# Patient Record
Sex: Male | Born: 1960
Health system: Southern US, Community
[De-identification: ages and names within clinical notes are randomized; demographics above are authoritative.]

## PROBLEM LIST (undated history)

## (undated) DIAGNOSIS — M545 Low back pain, unspecified: Secondary | ICD-10-CM

## (undated) DIAGNOSIS — E119 Type 2 diabetes mellitus without complications: Secondary | ICD-10-CM

## (undated) DIAGNOSIS — K219 Gastro-esophageal reflux disease without esophagitis: Secondary | ICD-10-CM

## (undated) DIAGNOSIS — I10 Essential (primary) hypertension: Secondary | ICD-10-CM

## (undated) DIAGNOSIS — A159 Respiratory tuberculosis unspecified: Secondary | ICD-10-CM

## (undated) DIAGNOSIS — B1910 Unspecified viral hepatitis B without hepatic coma: Secondary | ICD-10-CM

## (undated) DIAGNOSIS — G8929 Other chronic pain: Secondary | ICD-10-CM

## (undated) HISTORY — PX: JOINT REPLACEMENT: SHX530

---
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
## 2015-08-05 ENCOUNTER — Emergency Department (HOSPITAL_COMMUNITY): Payer: Medicaid Other

## 2015-08-05 ENCOUNTER — Encounter (HOSPITAL_COMMUNITY): Payer: Self-pay

## 2015-08-05 ENCOUNTER — Observation Stay (HOSPITAL_COMMUNITY): Payer: Medicaid Other

## 2015-08-05 ENCOUNTER — Observation Stay (HOSPITAL_COMMUNITY)
Admission: EM | Admit: 2015-08-05 | Discharge: 2015-08-09 | Disposition: A | Discharge status: Home / Self Care | Payer: Medicaid Other | Attending: Internal Medicine | Admitting: Internal Medicine

## 2015-08-05 DIAGNOSIS — E119 Type 2 diabetes mellitus without complications: Secondary | ICD-10-CM

## 2015-08-05 DIAGNOSIS — M545 Low back pain: Secondary | ICD-10-CM | POA: Diagnosis not present

## 2015-08-05 DIAGNOSIS — B181 Chronic viral hepatitis B without delta-agent: Secondary | ICD-10-CM | POA: Diagnosis not present

## 2015-08-05 DIAGNOSIS — K7290 Hepatic failure, unspecified without coma: Secondary | ICD-10-CM | POA: Diagnosis not present

## 2015-08-05 DIAGNOSIS — R4689 Other symptoms and signs involving appearance and behavior: Secondary | ICD-10-CM

## 2015-08-05 DIAGNOSIS — R9431 Abnormal electrocardiogram [ECG] [EKG]: Secondary | ICD-10-CM | POA: Insufficient documentation

## 2015-08-05 DIAGNOSIS — E440 Moderate protein-calorie malnutrition: Secondary | ICD-10-CM | POA: Insufficient documentation

## 2015-08-05 DIAGNOSIS — G9340 Encephalopathy, unspecified: Secondary | ICD-10-CM | POA: Diagnosis present

## 2015-08-05 DIAGNOSIS — R5381 Other malaise: Secondary | ICD-10-CM | POA: Diagnosis not present

## 2015-08-05 DIAGNOSIS — R278 Other lack of coordination: Secondary | ICD-10-CM | POA: Diagnosis not present

## 2015-08-05 DIAGNOSIS — I4581 Long QT syndrome: Secondary | ICD-10-CM | POA: Insufficient documentation

## 2015-08-05 DIAGNOSIS — Z79899 Other long term (current) drug therapy: Secondary | ICD-10-CM | POA: Insufficient documentation

## 2015-08-05 DIAGNOSIS — R4182 Altered mental status, unspecified: Secondary | ICD-10-CM | POA: Diagnosis present

## 2015-08-05 DIAGNOSIS — N179 Acute kidney failure, unspecified: Secondary | ICD-10-CM | POA: Diagnosis not present

## 2015-08-05 DIAGNOSIS — G8929 Other chronic pain: Secondary | ICD-10-CM | POA: Diagnosis not present

## 2015-08-05 DIAGNOSIS — K7682 Hepatic encephalopathy: Secondary | ICD-10-CM | POA: Insufficient documentation

## 2015-08-05 DIAGNOSIS — Z8739 Personal history of other diseases of the musculoskeletal system and connective tissue: Secondary | ICD-10-CM | POA: Diagnosis not present

## 2015-08-05 DIAGNOSIS — M488X6 Other specified spondylopathies, lumbar region: Secondary | ICD-10-CM | POA: Diagnosis not present

## 2015-08-05 DIAGNOSIS — R740 Nonspecific elevation of levels of transaminase and lactic acid dehydrogenase [LDH]: Secondary | ICD-10-CM | POA: Diagnosis not present

## 2015-08-05 DIAGNOSIS — R7401 Elevation of levels of liver transaminase levels: Secondary | ICD-10-CM | POA: Diagnosis present

## 2015-08-05 DIAGNOSIS — M5489 Other dorsalgia: Secondary | ICD-10-CM | POA: Insufficient documentation

## 2015-08-05 DIAGNOSIS — R531 Weakness: Secondary | ICD-10-CM | POA: Insufficient documentation

## 2015-08-05 DIAGNOSIS — K2210 Ulcer of esophagus without bleeding: Secondary | ICD-10-CM | POA: Diagnosis not present

## 2015-08-05 DIAGNOSIS — I10 Essential (primary) hypertension: Secondary | ICD-10-CM | POA: Diagnosis not present

## 2015-08-05 DIAGNOSIS — K7460 Unspecified cirrhosis of liver: Secondary | ICD-10-CM | POA: Insufficient documentation

## 2015-08-05 DIAGNOSIS — E1165 Type 2 diabetes mellitus with hyperglycemia: Secondary | ICD-10-CM | POA: Diagnosis not present

## 2015-08-05 DIAGNOSIS — F1021 Alcohol dependence, in remission: Secondary | ICD-10-CM

## 2015-08-05 HISTORY — DX: Type 2 diabetes mellitus without complications: E11.9

## 2015-08-05 HISTORY — DX: Low back pain: M54.5

## 2015-08-05 HISTORY — DX: Respiratory tuberculosis unspecified: A15.9

## 2015-08-05 HISTORY — DX: Unspecified viral hepatitis B without hepatic coma: B19.10

## 2015-08-05 HISTORY — DX: Gastro-esophageal reflux disease without esophagitis: K21.9

## 2015-08-05 HISTORY — DX: Low back pain, unspecified: M54.50

## 2015-08-05 HISTORY — DX: Other chronic pain: G89.29

## 2015-08-05 HISTORY — DX: Essential (primary) hypertension: I10

## 2015-08-05 LAB — COMPREHENSIVE METABOLIC PANEL
ALT: 84 U/L — AB (ref 17–63)
ANION GAP: 8 (ref 5–15)
AST: 89 U/L — ABNORMAL HIGH (ref 15–41)
Albumin: 2.3 g/dL — ABNORMAL LOW (ref 3.5–5.0)
Alkaline Phosphatase: 148 U/L — ABNORMAL HIGH (ref 38–126)
BUN: 21 mg/dL — ABNORMAL HIGH (ref 6–20)
CHLORIDE: 111 mmol/L (ref 101–111)
CO2: 19 mmol/L — AB (ref 22–32)
CREATININE: 1.6 mg/dL — AB (ref 0.61–1.24)
Calcium: 9.1 mg/dL (ref 8.9–10.3)
GFR, EST AFRICAN AMERICAN: 55 mL/min — AB (ref >60)
GFR, EST NON AFRICAN AMERICAN: 47 mL/min — AB (ref >60)
Glucose, Bld: 188 mg/dL — ABNORMAL HIGH (ref 65–99)
POTASSIUM: 4.6 mmol/L (ref 3.5–5.1)
SODIUM: 138 mmol/L (ref 135–145)
Total Bilirubin: 0.8 mg/dL (ref 0.3–1.2)
Total Protein: 7 g/dL (ref 6.5–8.1)

## 2015-08-05 LAB — URINALYSIS, ROUTINE W REFLEX MICROSCOPIC
BILIRUBIN URINE: NEGATIVE
Glucose, UA: 100 mg/dL — AB
Hgb urine dipstick: NEGATIVE
Ketones, ur: NEGATIVE mg/dL
LEUKOCYTES UA: NEGATIVE
NITRITE: NEGATIVE
PH: 7 (ref 5.0–8.0)
Protein, ur: >300 mg/dL — AB
SPECIFIC GRAVITY, URINE: 1.02 (ref 1.005–1.030)

## 2015-08-05 LAB — RAPID URINE DRUG SCREEN, HOSP PERFORMED
Amphetamines: NOT DETECTED
BARBITURATES: NOT DETECTED
BENZODIAZEPINES: NOT DETECTED
COCAINE: NOT DETECTED
OPIATES: NOT DETECTED
TETRAHYDROCANNABINOL: NOT DETECTED

## 2015-08-05 LAB — URINE MICROSCOPIC-ADD ON

## 2015-08-05 LAB — CBC WITH DIFFERENTIAL/PLATELET
Basophils Absolute: 0 10*3/uL (ref 0.0–0.1)
Basophils Relative: 0 %
EOS ABS: 0.2 10*3/uL (ref 0.0–0.7)
EOS PCT: 3 %
HCT: 45.7 % (ref 39.0–52.0)
Hemoglobin: 15.6 g/dL (ref 13.0–17.0)
LYMPHS ABS: 2.1 10*3/uL (ref 0.7–4.0)
LYMPHS PCT: 37 %
MCH: 30.8 pg (ref 26.0–34.0)
MCHC: 34.1 g/dL (ref 30.0–36.0)
MCV: 90.3 fL (ref 78.0–100.0)
MONO ABS: 0.4 10*3/uL (ref 0.1–1.0)
Monocytes Relative: 7 %
Neutro Abs: 3 10*3/uL (ref 1.7–7.7)
Neutrophils Relative %: 53 %
PLATELETS: 98 10*3/uL — AB (ref 150–400)
RBC: 5.06 MIL/uL (ref 4.22–5.81)
RDW: 13.8 % (ref 11.5–15.5)
WBC: 5.7 10*3/uL (ref 4.0–10.5)

## 2015-08-05 LAB — I-STAT CG4 LACTIC ACID, ED
LACTIC ACID, VENOUS: 2.44 mmol/L — AB (ref 0.5–1.9)
LACTIC ACID, VENOUS: 1.76 mmol/L (ref 0.5–1.9)

## 2015-08-05 LAB — GLUCOSE, CAPILLARY
GLUCOSE-CAPILLARY: 117 mg/dL — AB (ref 65–99)
Glucose-Capillary: 320 mg/dL — ABNORMAL HIGH (ref 65–99)

## 2015-08-05 LAB — ETHANOL: Alcohol, Ethyl (B): <5 mg/dL (ref <5)

## 2015-08-05 LAB — AMMONIA: AMMONIA: 125 umol/L — AB (ref 9–35)

## 2015-08-05 LAB — I-STAT TROPONIN, ED: Troponin i, poc: 0 ng/mL (ref 0.00–0.08)

## 2015-08-05 LAB — CBG MONITORING, ED: Glucose-Capillary: 186 mg/dL — ABNORMAL HIGH (ref 65–99)

## 2015-08-05 LAB — ACETAMINOPHEN LEVEL: Acetaminophen (Tylenol), Serum: <10 ug/mL — ABNORMAL LOW (ref 10–30)

## 2015-08-05 LAB — POC OCCULT BLOOD, ED: Fecal Occult Bld: NEGATIVE

## 2015-08-05 LAB — SALICYLATE LEVEL

## 2015-08-05 MED ORDER — LACTULOSE 10 GM/15ML PO SOLN
20.0000 g | Freq: Three times a day (TID) | ORAL | Status: DC
  Administered 2015-08-05 – 2015-08-06 (×2): 20 g via ORAL
  Filled 2015-08-05 (×2): qty 30

## 2015-08-05 MED ORDER — INSULIN ASPART 100 UNIT/ML ~~LOC~~ SOLN: 0.0000 [IU] | Freq: Three times a day (TID) | SUBCUTANEOUS | Status: DC

## 2015-08-05 MED ORDER — LORAZEPAM 2 MG/ML IJ SOLN: 0.5000 mg | Freq: Once | INTRAMUSCULAR | Status: DC

## 2015-08-05 MED ORDER — SODIUM CHLORIDE 0.9 % IV BOLUS (SEPSIS)
1000.0000 mL | Freq: Once | INTRAVENOUS | Status: AC
  Administered 2015-08-05: 1000 mL via INTRAVENOUS

## 2015-08-05 MED ORDER — FOLIC ACID 5 MG/ML IJ SOLN
1.0000 mg | Freq: Every day | INTRAMUSCULAR | Status: DC
  Administered 2015-08-06: 1 mg via INTRAVENOUS
  Filled 2015-08-05 (×2): qty 0.2

## 2015-08-05 MED ORDER — LACTULOSE 10 GM/15ML PO SOLN
30.0000 g | Freq: Once | ORAL | Status: DC
  Filled 2015-08-05: qty 45

## 2015-08-05 MED ORDER — INSULIN ASPART 100 UNIT/ML ~~LOC~~ SOLN
0.0000 [IU] | Freq: Three times a day (TID) | SUBCUTANEOUS | Status: DC
  Administered 2015-08-06: 2 [IU] via SUBCUTANEOUS
  Administered 2015-08-06: 3 [IU] via SUBCUTANEOUS
  Administered 2015-08-06: 1 [IU] via SUBCUTANEOUS
  Administered 2015-08-07: 9 [IU] via SUBCUTANEOUS
  Administered 2015-08-07: 3 [IU] via SUBCUTANEOUS
  Administered 2015-08-07 – 2015-08-08 (×2): 9 [IU] via SUBCUTANEOUS
  Administered 2015-08-08: 7 [IU] via SUBCUTANEOUS
  Administered 2015-08-09: 9 [IU] via SUBCUTANEOUS

## 2015-08-05 MED ORDER — THIAMINE HCL 100 MG/ML IJ SOLN
100.0000 mg | Freq: Every day | INTRAMUSCULAR | Status: DC
  Administered 2015-08-06: 100 mg via INTRAVENOUS
  Filled 2015-08-05: qty 2

## 2015-08-05 NOTE — ED Provider Notes (Signed)
Medical screening examination/treatment/procedure(s) were conducted as a shared visit with non-physician practitioner(s) and myself.  I personally evaluated the patient during the encounter.  55 year old male here with confusion and weakness. Recently had been [facility] [place] for osteomyelitis. Initially been doing better, but over the last week he has been living with his mom has had minor confusion during that time. Has also had some elevated blood sugars. Brought here for further evaluation here he has perseveration and some word finding difficulties but nothing that is consistent. His exam otherwise is wholly unremarkable. Clear lungs, heart with rrr no m/r/g, no rashes. No neuro findings.  Labs show a low bit of an AKI, CT scan with erosive changes in the vertebrae were no other acute findings. Area to history of osteomyelitis and possible concern for abscess versus encephalitis we will make the patient to medicine to get a MRI to ensure there is no osteomyelitis and for consideration of lumbar puncture to further workup of his encephalopathy.   EKG Interpretation   Date/Time:  Thursday August 05 2015 08:40:48 EDT Ventricular Rate:  83 PR Interval:    QRS Duration: 77 QT Interval:  460 QTC Calculation: 541 R Axis:   61 Text Interpretation:  Sinus rhythm Right atrial enlargement RSR' in V1 or  V2, probably normal variant Abnormal T, consider ischemia, anterior leads  Prolonged QT interval Confirmed by [facility], Blenda Wisecup [phone] on 08/05/2015  8:59:57 AM        Marily MemosJason Dail Lerew, MD 08/06/15 [id number]

## 2015-08-05 NOTE — ED Notes (Signed)
Pt from home with complaints of high blood sugar. Pt is lethargic and confused.

## 2015-08-05 NOTE — ED Notes (Signed)
Patient transported to MRI 

## 2015-08-05 NOTE — H&P (Signed)
Date: 08/05/2015               Patient Name:  Henry Solomon MRN: [id number]  DOB: 16-May-1960 Age / Sex: 55 y.o., male   PCP: Lavinia SharpsMary Ann Placey, NP           Medical Service: Internal Medicine Teaching Service         Attending Physician: Dr. Earl LagosNischal Narendra, MD    First Contact: Dr. Darreld McleanVishal Patel, MD Pager: [phone]  Second Contact: Dr. Heywood Ilesushil Patel, MD Pager: [phone]       After Hours (After 5p/  First Contact Pager: [phone]  weekends / holidays): Second Contact Pager: [phone]    Most Recent Discharge Date:     Chief Complaint:  Chief Complaint  Patient presents with  . Hyperglycemia       History of Present Illness:  Henry Solomon is a 55 y.o. male who currently resides in [place], [place] who has a PMH of diabetes mellitus recently discharged from [facility] on June 14 for ?osteomyelitis.  He mother brings him to the ED for confusion for the past several days and unable to feed himself.  Mother was not present during the history taking and patient is a very poor historian.  He answers questions directly when you ask him but is unable to carry on a meaningful conversation and is slow to respond.  He is unsure why he came to the hospital.  States he moved from [place] eight days ago.  He states he has a catheter and he is waiting on a ?colonoscopy.  He repeats himself over and over again stating he is waiting for results and to see the doctor.  He reports quitting alcohol eight months ago.  Denies any recreational drug use.  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  He is single and has one child.  Denies any chest pain, dyspnea, fever/chills, abdominal pain, diarrhea/constipation.  Does report some nausea and vomiting.  No rashes.  He reports a h/o DM, HTN, hepatitis B, and chronic back pain.  Of not,e he reports using a walker to ambulate and sometimes a cane.    In the ED, labs remarkable for elevated transaminases.  VSS.  CT lumbar spine with  extensive changes involving the lower 3 disc spaces at L3-4, L4-5, and L5-S1 with disc space narrowing, endplate destruction, and osteophyte formation.  Nonspecific sclerotic change in the L3, L4, and L5 vertebral bodies. Differential includes discitis and osteomyelitis, possibly chronic and treated. There is vacuum disc phenomena at L5-S1 which is typically not seen with discitis. Other possibilities include gout. Lumbar MRI without and with contrast suggested for further evaluation.    Meds: No current facility-administered medications for this encounter.   No current outpatient prescriptions on file.     (Not in a hospital admission)  Allergies: Allergies as of 08/05/2015  . (Not on File)    PMH: Past Medical History  Diagnosis Date  . Diabetes mellitus without complication (HCC)     PSH: No past surgical history on file.  FH: No family history on file.  SH: Social History  Substance Use Topics  . Smoking status: Not on file  . Smokeless tobacco: Not on file  . Alcohol Use: Not on file    Review of Systems: Pertinent items are noted in HPI.  All other systems reviewed and are negative.  Physical Exam: BP 162/102 mmHg  Pulse 74  Temp(Src) 97.5 F (36.4 C) (Oral)  Resp 14  SpO2 100%  Physical Exam Constitutional: Vital signs reviewed.  Patient is a chronically ill appearing male who appears much older than his age.    Head: Normocephalic and atraumatic Eyes: EOMI, conjunctivae normal.  Neck: Supple, Trachea midline .  Lymph nodes: Right groin lymphadenopathy.   Cardiovascular: RRR, no MRG. Pulmonary/Chest: normal respiratory effort, CTAB, no wheezes, rales, or rhonchi Abdominal: Soft. Thin. Non-tender, non-distended, bowel sounds are normal. Neurological: A&O x3, cranial nerve II-XII are grossly intact, no focal motor deficit. Asterixis present.  Answers questions appropriately although slow to respond.   Skin: Warm, dry and intact.  Psychiatric: Normal  mood and affect.  Delayed responses.    Lab results:  Basic Metabolic Panel:  Recent Labs  04/54/0906/29/17 0802  NA 138  K 4.6  CL 111  CO2 19*  GLUCOSE 188*  BUN 21*  CREATININE 1.60*  CALCIUM 9.1    Calcium/Magnesium/Phosphorus:  Recent Labs Lab 08/05/15 0802  CALCIUM 9.1    Liver Function Tests:  Recent Labs  08/05/15 0802  AST 89*  ALT 84*  ALKPHOS 148*  BILITOT 0.8  PROT 7.0  ALBUMIN 2.3*   No results for input(s): LIPASE, AMYLASE in the last 72 hours.  Recent Labs  08/05/15 1110  AMMONIA 125*    CBC: Lab Results  Component Value Date   WBC 5.7 08/05/2015   HGB 15.6 08/05/2015   HCT 45.7 08/05/2015   MCV 90.3 08/05/2015   PLT 98* 08/05/2015    Lipase: No results found for: LIPASE  Lactic Acid/Procalcitonin:  Recent Labs Lab 08/05/15 0812 08/05/15 1124  LATICACIDVEN 2.44* 1.76    Cardiac Enzymes:  Recent Labs  08/05/15 0811  TROPIPOC 0.00   No results found for: CKTOTAL, CKMB, CKMBINDEX, TROPONINI  BNP: No results for input(s): PROBNP in the last 72 hours.  D-Dimer: No results for input(s): DDIMER in the last 72 hours.  CBG:  Recent Labs  08/05/15 0657  GLUCAP 186*    Hemoglobin A1C: No results for input(s): HGBA1C in the last 72 hours.  Lipid Panel: No results for input(s): CHOL, HDL, LDLCALC, TRIG, CHOLHDL, LDLDIRECT in the last 72 hours.  Thyroid Function Tests: No results for input(s): TSH, T4TOTAL, FREET4, T3FREE, THYROIDAB in the last 72 hours.  Anemia Panel: No results for input(s): VITAMINB12, FOLATE, FERRITIN, TIBC, IRON, RETICCTPCT in the last 72 hours.  Coagulation: No results for input(s): LABPROT, INR in the last 72 hours.  Urine Drug Screen: Drugs of Abuse:  No results found for: LABOPIA, COCAINSCRNUR, LABBENZ, AMPHETMU, THCU, LABBARB  Alcohol Level:  Recent Labs  08/05/15 0802  ETH <5    Urinalysis: No results found for: COLORURINE, APPEARANCEUR, LABSPEC, PHURINE, GLUCOSEU, HGBUR,  BILIRUBINUR, KETONESUR, PROTEINUR, UROBILINOGEN, NITRITE, LEUKOCYTESUR  Imaging results:  Ct Head Wo Contrast  08/05/2015  CLINICAL DATA:  Lethargic and confusion for several days, known history of osteomyelitis of the spine EXAM: CT HEAD WITHOUT CONTRAST TECHNIQUE: Contiguous axial images were obtained from the base of the skull through the vertex without intravenous contrast. COMPARISON:  None. FINDINGS: Bony calvarium is intact. No findings to suggest acute hemorrhage, acute infarction or space-occupying mass lesion are noted. IMPRESSION: No acute abnormality noted. Electronically Signed   By: Alcide CleverMark  Lukens M.D.   On: 08/05/2015 10:28   Ct Thoracic Spine Wo Contrast  08/05/2015  CLINICAL DATA:  Spinal osteomyelitis. Prior records not available. Currently afebrile with normal white blood count. EXAM: CT THORACIC AND LUMBAR SPINE WITHOUT CONTRAST TECHNIQUE: Multidetector CT imaging of  the thoracic and lumbar spine was performed without contrast. Multiplanar CT image reconstructions were also generated. COMPARISON:  None. FINDINGS: CT THORACIC SPINE FINDINGS Thoracic dextroscoliosis. Normal sagittal alignment. Negative for fracture. Negative for mass lesion. No evidence of discitis/ osteomyelitis in the thoracic spine. Paraspinous soft tissues normal. Lungs are clear. No mediastinal mass. Advanced disc degeneration and spondylosis at C6-7. T1-2:  Mild spondylosis.  Right foraminal narrowing due to spurring. T2-3: Bilateral facet hypertrophy right greater than left with marked right foraminal encroachment. T3-4: Mild disc degeneration. Bilateral facet hypertrophy right greater than left with right foraminal encroachment of a moderate degree T4-5: Asymmetric facet hypertrophy on the right with right foraminal narrowing. T5-6:  Negative T6-7:  Negative T7-8:  Negative T8-9: Mild facet hypertrophy on the left with left foraminal narrowing. Mild right facet hypertrophy. T9-10:  Mild facet degeneration T10-11:  Moderate facet hypertrophy bilaterally causing foraminal encroachment bilaterally T11-12: Negative CT LUMBAR SPINE FINDINGS Mild dextroscoliosis L3-4. Mild anterior listhesis L3-4. Mild retrolisthesis L5-S1. Paraspinous muscles are symmetric and normal. Psoas muscle normal in size. No retroperitoneal mass or adenopathy. No significant edema in the paraspinous soft tissues. L1-2:  Mild disc and facet degeneration without spinal stenosis L2-3: Mild disc bulging and facet degeneration without significant spinal stenosis L3-4: Disc space narrowing with endplate destruction and irregularity at L3 and L4. There is posterior osteophyte formation causing mild spinal stenosis. No significant paravertebral soft tissue swelling. There is bony overgrowth anteriorly at the disc space. There is bilateral facet degeneration. L4-5: Similar changes at L4-5 with disc space narrowing and endplate irregularity. Cystic changes in the endplates at L3 and L4. Anterior osteophyte formation and posterior osteophyte formation. Diffuse disc bulging. Mild spinal stenosis. Mild facet degeneration bilaterally. L5-S1: Disc degeneration with vacuum disc phenomena. There is irregular endplate destruction of the L5 inferior endplate on the right. S1 endplate intact. Posterior osteophyte formation and disc bulging with foraminal narrowing bilaterally. Mild facet degeneration. IMPRESSION: CT THORACIC SPINE IMPRESSION Thoracic scoliosis and degenerative changes especially involving the facets at multiple levels. No evidence of thoracic spine fracture or infection CT LUMBAR SPINE IMPRESSION Extensive changes involving the lower 3 disc spaces at L3-4, L4-5, and L5-S1 with disc space narrowing, endplate destruction, and osteophyte formation. There is sclerotic change in the L3, L4, and L5 vertebral bodies. This appearance is nonspecific. Differential includes discitis and osteomyelitis, possibly chronic and treated. There is vacuum disc phenomena at L5-S1  which is typically not seen with discitis. Other possibilities include gout. Lumbar MRI without and with contrast suggested for further evaluation. Electronically Signed   By: Marlan Palau M.D.   On: 08/05/2015 11:01   Ct Lumbar Spine Wo Contrast  08/05/2015  CLINICAL DATA:  Spinal osteomyelitis. Prior records not available. Currently afebrile with normal white blood count. EXAM: CT THORACIC AND LUMBAR SPINE WITHOUT CONTRAST TECHNIQUE: Multidetector CT imaging of the thoracic and lumbar spine was performed without contrast. Multiplanar CT image reconstructions were also generated. COMPARISON:  None. FINDINGS: CT THORACIC SPINE FINDINGS Thoracic dextroscoliosis. Normal sagittal alignment. Negative for fracture. Negative for mass lesion. No evidence of discitis/ osteomyelitis in the thoracic spine. Paraspinous soft tissues normal. Lungs are clear. No mediastinal mass. Advanced disc degeneration and spondylosis at C6-7. T1-2:  Mild spondylosis.  Right foraminal narrowing due to spurring. T2-3: Bilateral facet hypertrophy right greater than left with marked right foraminal encroachment. T3-4: Mild disc degeneration. Bilateral facet hypertrophy right greater than left with right foraminal encroachment of a moderate degree T4-5: Asymmetric facet hypertrophy on  the right with right foraminal narrowing. T5-6:  Negative T6-7:  Negative T7-8:  Negative T8-9: Mild facet hypertrophy on the left with left foraminal narrowing. Mild right facet hypertrophy. T9-10:  Mild facet degeneration T10-11: Moderate facet hypertrophy bilaterally causing foraminal encroachment bilaterally T11-12: Negative CT LUMBAR SPINE FINDINGS Mild dextroscoliosis L3-4. Mild anterior listhesis L3-4. Mild retrolisthesis L5-S1. Paraspinous muscles are symmetric and normal. Psoas muscle normal in size. No retroperitoneal mass or adenopathy. No significant edema in the paraspinous soft tissues. L1-2:  Mild disc and facet degeneration without spinal  stenosis L2-3: Mild disc bulging and facet degeneration without significant spinal stenosis L3-4: Disc space narrowing with endplate destruction and irregularity at L3 and L4. There is posterior osteophyte formation causing mild spinal stenosis. No significant paravertebral soft tissue swelling. There is bony overgrowth anteriorly at the disc space. There is bilateral facet degeneration. L4-5: Similar changes at L4-5 with disc space narrowing and endplate irregularity. Cystic changes in the endplates at L3 and L4. Anterior osteophyte formation and posterior osteophyte formation. Diffuse disc bulging. Mild spinal stenosis. Mild facet degeneration bilaterally. L5-S1: Disc degeneration with vacuum disc phenomena. There is irregular endplate destruction of the L5 inferior endplate on the right. S1 endplate intact. Posterior osteophyte formation and disc bulging with foraminal narrowing bilaterally. Mild facet degeneration. IMPRESSION: CT THORACIC SPINE IMPRESSION Thoracic scoliosis and degenerative changes especially involving the facets at multiple levels. No evidence of thoracic spine fracture or infection CT LUMBAR SPINE IMPRESSION Extensive changes involving the lower 3 disc spaces at L3-4, L4-5, and L5-S1 with disc space narrowing, endplate destruction, and osteophyte formation. There is sclerotic change in the L3, L4, and L5 vertebral bodies. This appearance is nonspecific. Differential includes discitis and osteomyelitis, possibly chronic and treated. There is vacuum disc phenomena at L5-S1 which is typically not seen with discitis. Other possibilities include gout. Lumbar MRI without and with contrast suggested for further evaluation. Electronically Signed   By: Marlan Palau M.D.   On: 08/05/2015 11:01    EKG: EKG Interpretation  Date/Time:  Thursday August 05 2015 08:40:48 EDT Ventricular Rate:  83 PR Interval:    QRS Duration: 77 QT Interval:  460 QTC Calculation: 541 R Axis:   61 Text  Interpretation:  Sinus rhythm Right atrial enlargement RSR' in V1 or V2, probably normal variant Abnormal T, consider ischemia, anterior leads Prolonged QT interval Confirmed by [facility] MD, Barbara Cower [phone]) on 08/05/2015 8:59:57 AM   Antibiotics: Antibiotics Given (last 72 hours)    None      Anti-infectives    None     Consults:     Assessment & Plan by Problem: Principal Problem:   Acute encephalopathy Active Problems:   Diabetes mellitus without complication (HCC)   Elevated transaminase level  Acute encephalopathy Unclear etiology, CT head neg, but possibly hepatic encephalopathy given elevated transaminases and h/o hepatitis B reported by patient.  Also asterixis noted.  Infection seems less likely given no leukocytosis and afebrile.  CT spine possible diskitis?  UDS is pending.  -obtain [facility] from [facility]  -begin empiric tx with lactulose -additional tests per below (elevated transaminases)  -UDS pending  -MRI thoracic/lumbar spine pending   Elevated transaminases ALT/AST elevated.  Albumin and platelets low. Normal APAP level.   -consider RUQ [place] -monitor transaminases -check hepatitis panel, PT  Chronic back pain -per above  Diabetes Mellitus II  -HA1C -d/c home meds  -SSI-S -ac and hs cbg  Substance abuse  Pt has a h/o alcohol abuse.  States  he quit drinking alcohol.  -MVI, thiamine 100mg  daily, folic acid 1mg  daily  Deconditioning  -PT/OT consult  FEN  Fluids-None Electrolytes-Monitor Nutrition- Carb modified/Heart healthy  VTE prophylaxis  SCDs  Disposition Disposition deferred at this time, awaiting improvement of current medical problems. Anticipated discharge in approximately 1-2 day(s).      Emergency Contact Contact Information    Name Relation Home Work Mobile   [place] Mother [phone]        The patient does not know have a current PCP [name], NP) and does need an [facility] hospital follow-up appointment  after discharge.  Signed Marrian Salvage, MD Internal Medicine Teaching Service, PGY-3 08/05/2015, 12:53 PM

## 2015-08-05 NOTE — ED Notes (Signed)
IV team at bedside 

## 2015-08-05 NOTE — ED Notes (Signed)
Patient transported to CT 

## 2015-08-05 NOTE — ED Notes (Signed)
Pt returned from CT °

## 2015-08-05 NOTE — ED Provider Notes (Signed)
CSN: [id number]     Arrival date & time 08/05/15  9562 History   First MD Initiated Contact with Patient 08/05/15 [phone]     Chief Complaint  Patient presents with  . Hyperglycemia     (Consider location/radiation/quality/duration/timing/severity/associated sxs/prior Treatment) The history is provided by the patient and a parent.     Henry Solomon is a 55 y.o. male, with a history of DM, presenting to the ED with confusion for the past 4-5 days. Was in [place] where he has lived all his life. Was admitted for 8 days [facility] with a diagnoses of osteomyelitis of the spine, hyperglycemia, and esophagitis. Was not given any antibiotics as they were deemed unnecessary. Patient's mother states patient was discharged on June 13 and she brought him down to [place] on June 14. Reports that the patient started to be confused and "talking nonsense" for the last for 4-5 days. Mother also states that the patient has had some abnormal behavior. She has had to feed him for the last 2-3 days because he will not feed himself. States, "It's as if he forgot how to eat." States he used the restroom yesterday and tried to wash his hands in the toilet. Endorses hyperglycemia with blood sugars between 200 and 400. Patient uses a walker at baseline due to chronic right hip pain. Patient currently only complains of mild mid and lower back and right hip pain. Accompanied by his mother who gives some of the history. Patient's mother states the patient does not drink alcohol, but patient corrects this and states that he "sometimes drinks [place]." Patient would not say how much he drinks and states his last drink was somewhere between 3 and 7 months ago (answer varies with each time the question is asked). Patient denies illicit drug use. Patient denies falls/trauma, nausea/vomiting, shortness of breath, chest pain, or any other complaints.    Past Medical History  Diagnosis Date   . Diabetes mellitus without complication (HCC)    No past surgical history on file. No family history on file. Social History  Substance Use Topics  . Smoking status: Not on file  . Smokeless tobacco: Not on file  . Alcohol Use: Not on file    Review of Systems  Constitutional: Negative for fever, chills and diaphoresis.  Respiratory: Negative for shortness of breath.   Cardiovascular: Negative for chest pain.  Gastrointestinal: Negative for nausea, vomiting and abdominal pain.  Musculoskeletal: Positive for back pain and arthralgias.  Neurological: Negative for dizziness, syncope, weakness, light-headedness, numbness and headaches.      Allergies  Review of patient's allergies indicates not on file.  Home Medications   Prior to Admission medications   Not on File   BP 147/100 mmHg  Pulse 79  Temp(Src) 97.9 F (36.6 C) (Oral)  Resp 20  SpO2 100% Physical Exam  Constitutional: He is oriented to person, place, and time. He appears well-developed and well-nourished. No distress.  HENT:  Head: Normocephalic and atraumatic.  Mouth/Throat: Oropharynx is clear and moist.  Eyes: Conjunctivae and EOM are normal. Pupils are equal, round, and reactive to light.  Neck: Neck supple.  Cardiovascular: Normal rate, regular rhythm, normal heart sounds and intact distal pulses.   Pulmonary/Chest: Effort normal and breath sounds normal. No respiratory distress.  Abdominal: Soft. There is no tenderness. There is no guarding.  Genitourinary: Guaiac negative stool.  Rectal tone is present, but weaker than expected. No external hemorrhoids, fissures, or lesions noted.  No gross blood. Scant stool present. No rectal tenderness. Prostate is not enlarged or boggy and is nontender. RN, Donnamae JudeKeshia, served as chaperone during the rectal exam.   Musculoskeletal: He exhibits no edema or tenderness.  Tenderness to the midline thoracic and lumbar spine. Question of scoliotic spine.  Lymphadenopathy:     He has no cervical adenopathy.  Neurological: He is alert and oriented to person, place, and time. He has normal reflexes.  Lethargic, but alert. No sensory deficits. Strength 5/5 in all extremities, but question of some arm drift on the left. Gait testing deferred. Cranial nerves III-XII grossly intact. No facial droop. Pt had some difficulty with touching his thumb to each one of the other fingers. He just kept touching his thumb to his index fingers bilaterally. Other than this, patient follows commands. Patient also keeps repeating things like, "I just saw the doctor. He just did an EKG on my back. He already gave me pills." Pt also mumbles to himself.  Skin: Skin is warm and dry. He is not diaphoretic.  Full skin exam reveals no abnormalities.   Psychiatric: He has a normal mood and affect. His behavior is normal.  Nursing note and vitals reviewed.   ED Course  Procedures (including critical care time) Labs Review Labs Reviewed  CBC WITH DIFFERENTIAL/PLATELET - Abnormal; Notable for the following:    Platelets 98 (*)    All other components within normal limits  COMPREHENSIVE METABOLIC PANEL - Abnormal; Notable for the following:    CO2 19 (*)    Glucose, Bld 188 (*)    BUN 21 (*)    Creatinine, Ser 1.60 (*)    Albumin 2.3 (*)    AST 89 (*)    ALT 84 (*)    Alkaline Phosphatase 148 (*)    GFR calc non Af Amer 47 (*)    GFR calc Af Amer 55 (*)    All other components within normal limits  ACETAMINOPHEN LEVEL - Abnormal; Notable for the following:    Acetaminophen (Tylenol), Serum <10 (*)    All other components within normal limits  CBG MONITORING, ED - Abnormal; Notable for the following:    Glucose-Capillary 186 (*)    All other components within normal limits  I-STAT CG4 LACTIC ACID, ED - Abnormal; Notable for the following:    Lactic Acid, Venous 2.44 (*)    All other components within normal limits  CULTURE, BLOOD (ROUTINE X 2)  CULTURE, BLOOD (ROUTINE X 2)  URINE  CULTURE  ETHANOL  SALICYLATE LEVEL  URINALYSIS, ROUTINE W REFLEX MICROSCOPIC (NOT AT [facility])  URINE RAPID DRUG SCREEN, HOSP PERFORMED  AMMONIA  HIV ANTIBODY (ROUTINE TESTING)  HEPATITIS PANEL, ACUTE  PROTIME-INR  CBG MONITORING, ED  I-STAT TROPOININ, ED  POC OCCULT BLOOD, ED  I-STAT CG4 LACTIC ACID, ED    Imaging Review Ct Head Wo Contrast  08/05/2015  CLINICAL DATA:  Lethargic and confusion for several days, known history of osteomyelitis of the spine EXAM: CT HEAD WITHOUT CONTRAST TECHNIQUE: Contiguous axial images were obtained from the base of the skull through the vertex without intravenous contrast. COMPARISON:  None. FINDINGS: Bony calvarium is intact. No findings to suggest acute hemorrhage, acute infarction or space-occupying mass lesion are noted. IMPRESSION: No acute abnormality noted. Electronically Signed   By: Alcide CleverMark  Lukens M.D.   On: 08/05/2015 10:28   Ct Thoracic Spine Wo Contrast  08/05/2015  CLINICAL DATA:  Spinal osteomyelitis. Prior records not available. Currently afebrile with normal white  blood count. EXAM: CT THORACIC AND LUMBAR SPINE WITHOUT CONTRAST TECHNIQUE: Multidetector CT imaging of the thoracic and lumbar spine was performed without contrast. Multiplanar CT image reconstructions were also generated. COMPARISON:  None. FINDINGS: CT THORACIC SPINE FINDINGS Thoracic dextroscoliosis. Normal sagittal alignment. Negative for fracture. Negative for mass lesion. No evidence of discitis/ osteomyelitis in the thoracic spine. Paraspinous soft tissues normal. Lungs are clear. No mediastinal mass. Advanced disc degeneration and spondylosis at C6-7. T1-2:  Mild spondylosis.  Right foraminal narrowing due to spurring. T2-3: Bilateral facet hypertrophy right greater than left with marked right foraminal encroachment. T3-4: Mild disc degeneration. Bilateral facet hypertrophy right greater than left with right foraminal encroachment of a moderate degree T4-5: Asymmetric facet  hypertrophy on the right with right foraminal narrowing. T5-6:  Negative T6-7:  Negative T7-8:  Negative T8-9: Mild facet hypertrophy on the left with left foraminal narrowing. Mild right facet hypertrophy. T9-10:  Mild facet degeneration T10-11: Moderate facet hypertrophy bilaterally causing foraminal encroachment bilaterally T11-12: Negative CT LUMBAR SPINE FINDINGS Mild dextroscoliosis L3-4. Mild anterior listhesis L3-4. Mild retrolisthesis L5-S1. Paraspinous muscles are symmetric and normal. Psoas muscle normal in size. No retroperitoneal mass or adenopathy. No significant edema in the paraspinous soft tissues. L1-2:  Mild disc and facet degeneration without spinal stenosis L2-3: Mild disc bulging and facet degeneration without significant spinal stenosis L3-4: Disc space narrowing with endplate destruction and irregularity at L3 and L4. There is posterior osteophyte formation causing mild spinal stenosis. No significant paravertebral soft tissue swelling. There is bony overgrowth anteriorly at the disc space. There is bilateral facet degeneration. L4-5: Similar changes at L4-5 with disc space narrowing and endplate irregularity. Cystic changes in the endplates at L3 and L4. Anterior osteophyte formation and posterior osteophyte formation. Diffuse disc bulging. Mild spinal stenosis. Mild facet degeneration bilaterally. L5-S1: Disc degeneration with vacuum disc phenomena. There is irregular endplate destruction of the L5 inferior endplate on the right. S1 endplate intact. Posterior osteophyte formation and disc bulging with foraminal narrowing bilaterally. Mild facet degeneration. IMPRESSION: CT THORACIC SPINE IMPRESSION Thoracic scoliosis and degenerative changes especially involving the facets at multiple levels. No evidence of thoracic spine fracture or infection CT LUMBAR SPINE IMPRESSION Extensive changes involving the lower 3 disc spaces at L3-4, L4-5, and L5-S1 with disc space narrowing, endplate  destruction, and osteophyte formation. There is sclerotic change in the L3, L4, and L5 vertebral bodies. This appearance is nonspecific. Differential includes discitis and osteomyelitis, possibly chronic and treated. There is vacuum disc phenomena at L5-S1 which is typically not seen with discitis. Other possibilities include gout. Lumbar MRI without and with contrast suggested for further evaluation. Electronically Signed   By: Marlan Palau M.D.   On: 08/05/2015 11:01   Ct Lumbar Spine Wo Contrast  08/05/2015  CLINICAL DATA:  Spinal osteomyelitis. Prior records not available. Currently afebrile with normal white blood count. EXAM: CT THORACIC AND LUMBAR SPINE WITHOUT CONTRAST TECHNIQUE: Multidetector CT imaging of the thoracic and lumbar spine was performed without contrast. Multiplanar CT image reconstructions were also generated. COMPARISON:  None. FINDINGS: CT THORACIC SPINE FINDINGS Thoracic dextroscoliosis. Normal sagittal alignment. Negative for fracture. Negative for mass lesion. No evidence of discitis/ osteomyelitis in the thoracic spine. Paraspinous soft tissues normal. Lungs are clear. No mediastinal mass. Advanced disc degeneration and spondylosis at C6-7. T1-2:  Mild spondylosis.  Right foraminal narrowing due to spurring. T2-3: Bilateral facet hypertrophy right greater than left with marked right foraminal encroachment. T3-4: Mild disc degeneration. Bilateral facet hypertrophy right greater  than left with right foraminal encroachment of a moderate degree T4-5: Asymmetric facet hypertrophy on the right with right foraminal narrowing. T5-6:  Negative T6-7:  Negative T7-8:  Negative T8-9: Mild facet hypertrophy on the left with left foraminal narrowing. Mild right facet hypertrophy. T9-10:  Mild facet degeneration T10-11: Moderate facet hypertrophy bilaterally causing foraminal encroachment bilaterally T11-12: Negative CT LUMBAR SPINE FINDINGS Mild dextroscoliosis L3-4. Mild anterior listhesis L3-4.  Mild retrolisthesis L5-S1. Paraspinous muscles are symmetric and normal. Psoas muscle normal in size. No retroperitoneal mass or adenopathy. No significant edema in the paraspinous soft tissues. L1-2:  Mild disc and facet degeneration without spinal stenosis L2-3: Mild disc bulging and facet degeneration without significant spinal stenosis L3-4: Disc space narrowing with endplate destruction and irregularity at L3 and L4. There is posterior osteophyte formation causing mild spinal stenosis. No significant paravertebral soft tissue swelling. There is bony overgrowth anteriorly at the disc space. There is bilateral facet degeneration. L4-5: Similar changes at L4-5 with disc space narrowing and endplate irregularity. Cystic changes in the endplates at L3 and L4. Anterior osteophyte formation and posterior osteophyte formation. Diffuse disc bulging. Mild spinal stenosis. Mild facet degeneration bilaterally. L5-S1: Disc degeneration with vacuum disc phenomena. There is irregular endplate destruction of the L5 inferior endplate on the right. S1 endplate intact. Posterior osteophyte formation and disc bulging with foraminal narrowing bilaterally. Mild facet degeneration. IMPRESSION: CT THORACIC SPINE IMPRESSION Thoracic scoliosis and degenerative changes especially involving the facets at multiple levels. No evidence of thoracic spine fracture or infection CT LUMBAR SPINE IMPRESSION Extensive changes involving the lower 3 disc spaces at L3-4, L4-5, and L5-S1 with disc space narrowing, endplate destruction, and osteophyte formation. There is sclerotic change in the L3, L4, and L5 vertebral bodies. This appearance is nonspecific. Differential includes discitis and osteomyelitis, possibly chronic and treated. There is vacuum disc phenomena at L5-S1 which is typically not seen with discitis. Other possibilities include gout. Lumbar MRI without and with contrast suggested for further evaluation. Electronically Signed   By:  Marlan Palauharles  Clark M.D.   On: 08/05/2015 11:01   I have personally reviewed and evaluated these images and lab results as part of my medical decision-making.   EKG Interpretation   Date/Time:  Thursday August 05 2015 08:40:48 EDT Ventricular Rate:  83 PR Interval:    QRS Duration: 77 QT Interval:  460 QTC Calculation: 541 R Axis:   61 Text Interpretation:  Sinus rhythm Right atrial enlargement RSR' in V1 or  V2, probably normal variant Abnormal T, consider ischemia, anterior leads  Prolonged QT interval Confirmed by [facility] MD, Barbara CowerJASON [phone]([id number]) on 08/05/2015  8:59:57 AM      MDM   Final diagnoses:  Abnormal behavior  Prolonged Q-T interval on ECG  History of osteomyelitis  Midline back pain  History of osteomyelitis  Midline back pain    Henry Solomon presents with reported abnormal behavior and possible confusion along with hyperglycemia for the past 4-5 days.  Findings and plan of care discussed with Henry MemosJason Mesner, MD. Dr. Clayborne DanaMesner personally evaluated and examined this patient.  Asked the secretary to request [facility], especially patient's discharge summary from June 13 as well as all imaging studies.  [facility] MRI machines is down and there is a backlog on the remaining machine. CT of the lumbar and thoracic spine ordered instead. 9:33 AM Spoke with radiologist, Clementeen HoofSteve Reeves, who agreed that the MRI would be the best imaging, however, in light  of the backlog the CT would suffice for now. Noncontrast CT due to patient's renal function. Patient will need to be admitted due to altered mental status, AKI, and question of osteomyelitis. 10:51 AM Spoke with radiologist, Marlan Palau, regarding the patient's CT of the spine; states pt has severe degenerative disease vs infection in the lower lumbar spine. Could be infection, but in absence of leukocytosis, tachycardia, and/or fever infection is less likely. Recommends putting patient in  the MRI queue to better evaluate this issue.  10:58 AM Spoke with Boykin Peek, Internal Medicine resident, who agreed to come see the patient for admission evaluation.   Filed Vitals:   08/05/15 0702 08/05/15 0730  BP: 147/96 147/100  Pulse: 82 79  Temp: 97.9 F (36.6 C)   TempSrc: Oral   Resp: 18 20  SpO2: 99% 100%   Filed Vitals:   08/05/15 0730 08/05/15 0800 08/05/15 0830 08/05/15 0900  BP: 147/100 152/96 163/105 152/93  Pulse: 79 70 85 72  Temp:      TempSrc:      Resp: SpO2: 100% 100% 100% 100%   Filed Vitals:   08/05/15 0930 08/05/15 1012 08/05/15 1030 08/05/15 1100  BP: 157/107 167/106 169/91 162/102  Pulse: 76 76 58 74  Temp:  97.5 F (36.4 C)    TempSrc:  Oral    Resp: SpO2: 100% 100% 98% 100%     Henry Pancoast, PA-C 08/05/15 1142  Henry Pancoast, PA-C 08/05/15 1145  Henry Memos, MD 08/06/15 [phone]

## 2015-08-05 NOTE — ED Notes (Signed)
Patient stood on side of bed with assist, to try use urinal, cause we need a urinalysis, but wasn't able to use it. The Nurse was informed.

## 2015-08-06 ENCOUNTER — Observation Stay (HOSPITAL_COMMUNITY): Payer: Medicaid Other

## 2015-08-06 DIAGNOSIS — R531 Weakness: Secondary | ICD-10-CM

## 2015-08-06 DIAGNOSIS — R4182 Altered mental status, unspecified: Secondary | ICD-10-CM | POA: Insufficient documentation

## 2015-08-06 DIAGNOSIS — M488X6 Other specified spondylopathies, lumbar region: Secondary | ICD-10-CM | POA: Diagnosis not present

## 2015-08-06 DIAGNOSIS — K2210 Ulcer of esophagus without bleeding: Secondary | ICD-10-CM | POA: Diagnosis not present

## 2015-08-06 DIAGNOSIS — M5489 Other dorsalgia: Secondary | ICD-10-CM | POA: Insufficient documentation

## 2015-08-06 DIAGNOSIS — G9340 Encephalopathy, unspecified: Secondary | ICD-10-CM | POA: Diagnosis not present

## 2015-08-06 DIAGNOSIS — R5381 Other malaise: Secondary | ICD-10-CM | POA: Diagnosis not present

## 2015-08-06 LAB — HEPATITIS PANEL, ACUTE
Hep A IgM: NEGATIVE
Hep B C IgM: NEGATIVE
Hepatitis B Surface Ag: POSITIVE — AB

## 2015-08-06 LAB — CBC
HEMATOCRIT: 42.3 % (ref 39.0–52.0)
HEMOGLOBIN: 14.2 g/dL (ref 13.0–17.0)
MCH: 30.9 pg (ref 26.0–34.0)
MCHC: 33.6 g/dL (ref 30.0–36.0)
MCV: 92.2 fL (ref 78.0–100.0)
Platelets: 109 10*3/uL — ABNORMAL LOW (ref 150–400)
RBC: 4.59 MIL/uL (ref 4.22–5.81)
RDW: 13.8 % (ref 11.5–15.5)
WBC: 6.2 10*3/uL (ref 4.0–10.5)

## 2015-08-06 LAB — COMPREHENSIVE METABOLIC PANEL
ALBUMIN: 2.1 g/dL — AB (ref 3.5–5.0)
ALK PHOS: 157 U/L — AB (ref 38–126)
ALT: 82 U/L — AB (ref 17–63)
ANION GAP: 9 (ref 5–15)
AST: 105 U/L — AB (ref 15–41)
BILIRUBIN TOTAL: 0.6 mg/dL (ref 0.3–1.2)
BUN: 21 mg/dL — AB (ref 6–20)
CALCIUM: 8.9 mg/dL (ref 8.9–10.3)
CO2: 20 mmol/L — AB (ref 22–32)
Chloride: 106 mmol/L (ref 101–111)
Creatinine, Ser: 1.41 mg/dL — ABNORMAL HIGH (ref 0.61–1.24)
GFR calc Af Amer: >60 mL/min (ref >60)
GFR calc non Af Amer: 55 mL/min — ABNORMAL LOW (ref >60)
GLUCOSE: 304 mg/dL — AB (ref 65–99)
Potassium: 4.7 mmol/L (ref 3.5–5.1)
SODIUM: 135 mmol/L (ref 135–145)
Total Protein: 6.6 g/dL (ref 6.5–8.1)

## 2015-08-06 LAB — URINE CULTURE

## 2015-08-06 LAB — HIV ANTIBODY (ROUTINE TESTING W REFLEX): HIV Screen 4th Generation wRfx: NONREACTIVE

## 2015-08-06 LAB — GLUCOSE, CAPILLARY
Glucose-Capillary: 243 mg/dL — ABNORMAL HIGH (ref 65–99)
Glucose-Capillary: 146 mg/dL — ABNORMAL HIGH (ref 65–99)
Glucose-Capillary: 195 mg/dL — ABNORMAL HIGH (ref 65–99)
Glucose-Capillary: 255 mg/dL — ABNORMAL HIGH (ref 65–99)

## 2015-08-06 LAB — PROTIME-INR
INR: 1.08 (ref 0.00–1.49)
Prothrombin Time: 14.2 seconds (ref 11.6–15.2)

## 2015-08-06 MED ORDER — GLIPIZIDE 5 MG PO TABS
5.0000 mg | ORAL_TABLET | Freq: Two times a day (BID) | ORAL | Status: DC
  Administered 2015-08-06 – 2015-08-09 (×7): 5 mg via ORAL
  Filled 2015-08-06 (×7): qty 1

## 2015-08-06 MED ORDER — RIFAXIMIN 550 MG PO TABS
550.0000 mg | ORAL_TABLET | Freq: Two times a day (BID) | ORAL | Status: DC
  Administered 2015-08-06 – 2015-08-09 (×7): 550 mg via ORAL
  Filled 2015-08-06 (×7): qty 1

## 2015-08-06 MED ORDER — LORAZEPAM 2 MG/ML IJ SOLN
0.5000 mg | Freq: Once | INTRAMUSCULAR | Status: AC
  Administered 2015-08-06: 0.5 mg via INTRAVENOUS

## 2015-08-06 MED ORDER — FOLIC ACID 1 MG PO TABS
1.0000 mg | ORAL_TABLET | Freq: Every day | ORAL | Status: DC
  Administered 2015-08-07 – 2015-08-09 (×3): 1 mg via ORAL
  Filled 2015-08-06 (×3): qty 1

## 2015-08-06 MED ORDER — LORAZEPAM 2 MG/ML IJ SOLN
INTRAMUSCULAR | Status: AC
  Administered 2015-08-06: 08:00:00
  Filled 2015-08-06: qty 1

## 2015-08-06 MED ORDER — LACTULOSE 10 GM/15ML PO SOLN
30.0000 g | Freq: Three times a day (TID) | ORAL | Status: DC
  Administered 2015-08-06 – 2015-08-09 (×9): 30 g via ORAL
  Filled 2015-08-06 (×9): qty 45

## 2015-08-06 MED ORDER — RIFAXIMIN 200 MG PO TABS: 200.0000 mg | ORAL_TABLET | Freq: Three times a day (TID) | ORAL | Status: DC

## 2015-08-06 MED ORDER — PANTOPRAZOLE SODIUM 40 MG PO TBEC
40.0000 mg | DELAYED_RELEASE_TABLET | Freq: Two times a day (BID) | ORAL | Status: DC
  Administered 2015-08-06 – 2015-08-09 (×7): 40 mg via ORAL
  Filled 2015-08-06 (×7): qty 1

## 2015-08-06 MED ORDER — VITAMIN B-1 100 MG PO TABS
100.0000 mg | ORAL_TABLET | Freq: Every day | ORAL | Status: DC
  Administered 2015-08-07 – 2015-08-09 (×3): 100 mg via ORAL
  Filled 2015-08-06 (×3): qty 1

## 2015-08-06 NOTE — Evaluation (Signed)
Occupational Therapy Evaluation Patient Details Name: Henry Solomon MRN: [id number] DOB: [birth year] Today's Date: 08/06/2015    History of Present Illness pt is a 55 y/o male with pmh of DM, HTN, HEP B, and chronic back pain, admitted to the ED by his Mom with confusion and even inability to feed himself over the past several days.  Recently discharged from [facility] in [place] for back pain and ?osteomyelitiis which was supposedly ruled out.  CT showing sclerotic changes in L3,4,5 incl changes in disc spaces at L34, L45,  and L5S1.  Work up including discitis, osteo and gout.  MRI pending.   Clinical Impression   Pt and family report he was extremely independent with ADLs and mobility PTA. Currently pt overall min assist for basic transfers and min-mod assist for ADLs. Pt presenting with bil UE weakness, decreased fine/gross motor coordination, and impaired cognition. Pt noted to have R visual bias but formal testing difficult due to impaired cognition. Pt planning to d/c home to his mothers house where he will have 24/7 supervision. Recommending HHOT for follow up in order to maximize independence and safety with ADLs and functional mobility. Pt would benefit from continued skilled OT to address established goals.    Follow Up Recommendations  Home health OT;Supervision/Assistance - 24 hour    Equipment Recommendations  None recommended by OT    Recommendations for Other Services       Precautions / Restrictions Precautions Precautions: Fall;Back Restrictions Weight Bearing Restrictions: No      Mobility Bed Mobility Overal bed mobility: Needs Assistance Bed Mobility: Supine to Sit;Sit to Supine     Supine to sit: Min assist Sit to supine: Min assist   General bed mobility comments: Multimodal cues for initiation and sequencing. Assist for scooting to EOB and returning LEs to bed.  Transfers Overall transfer level: Needs assistance Equipment used: 1 person  hand held assist Transfers: Sit to/from Stand Sit to Stand: Min assist Stand pivot transfers: Min guard       General transfer comment: Min hand held assist for sit to stand from EOB x 1. Hand held assist provided for side stepping to reposition in bed.    Balance Overall balance assessment: Needs assistance Sitting-balance support: Single extremity supported;Feet supported Sitting balance-Leahy Scale: Fair Sitting balance - Comments: fall   Standing balance support: Bilateral upper extremity supported Standing balance-Leahy Scale: Poor                              ADL Overall ADL's : Needs assistance/impaired   Eating/Feeding Details (indicate cue type and reason): Pt able to bring chip to mouth x 2 to self feed but family reports he has had difficulty feeding self; mother having to feed pt to get nutrition. Grooming: Minimal assistance;Cueing for safety;Cueing for sequencing;Sitting   Upper Body Bathing: Minimal assitance;Sitting;Cueing for sequencing;Cueing for safety   Lower Body Bathing: Moderate assistance;Sit to/from stand;Cueing for safety;Cueing for sequencing   Upper Body Dressing : Minimal assistance;Sitting;Cueing for sequencing;Cueing for safety   Lower Body Dressing: Moderate assistance;Sit to/from stand;Cueing for safety;Cueing for sequencing               Functional mobility during ADLs: Minimal assistance (for sit to stand and side stepping) General ADL Comments: Pt peseverating on "getting lunch" despite many verbal reminders that dinner is comming. Pt unable to read clock to tell what time it was and inconsistently follow commands to  assess vision and UB strength/coordination.     Vision Vision Assessment?: Vision impaired- to be further tested in functional context Additional Comments: Difficult to assess due to impaired cognition. Seems to have R visual bias.   Perception     Praxis      Pertinent Vitals/Pain Pain Assessment:  Faces Pain Score: 3  Faces Pain Scale: Hurts even more Pain Location: back, L hip and side Pain Descriptors / Indicators: Aching;Grimacing;Guarding Pain Intervention(s): Monitored during session     Hand Dominance  (ambidextrous)   Extremity/Trunk Assessment Upper Extremity Assessment Upper Extremity Assessment: Generalized weakness;Difficult to assess due to impaired cognition (fine/gross motor coordination deficits bil)   Lower Extremity Assessment Lower Extremity Assessment: Defer to PT evaluation   Cervical / Trunk Assessment Cervical / Trunk Assessment: Kyphotic   Communication Communication Communication: No difficulties   Cognition Arousal/Alertness: Lethargic Behavior During Therapy: Flat affect Overall Cognitive Status: Impaired/Different from baseline Area of Impairment: Orientation;Following commands;Safety/judgement;Awareness;Problem solving Orientation Level: Disoriented to;Time;Situation     Following Commands: Follows one step commands inconsistently;Follows one step commands with increased time Safety/Judgement: Decreased awareness of safety;Decreased awareness of deficits Awareness: Intellectual Problem Solving: Slow processing;Decreased initiation;Requires verbal cues;Requires tactile cues     General Comments       Exercises       Shoulder Instructions      Home Living Family/patient expects to be discharged to:: Private residence Living Arrangements: Parent Available Help at Discharge: Family;Available 24 hours/day Type of Home: House Home Access: Stairs to enter Entergy CorporationEntrance Stairs-Number of Steps: several Entrance Stairs-Rails: Right;Left Home Layout: One level     Bathroom Shower/Tub: Producer, television/film/videoWalk-in shower   Bathroom Toilet: Standard     Home Equipment: Environmental consultantWalker - 2 wheels;Cane - single point;Shower seat - built in   Additional Comments: Planning to stay at mothers house upon d/c; above information refelcts mothers house      Prior  Functioning/Environment Level of Independence: Independent with assistive device(s)        Comments: It's been getting more difficult    OT Diagnosis: Generalized weakness;Cognitive deficits;Disturbance of vision;Acute pain;Altered mental status   OT Problem List: Decreased strength;Decreased activity tolerance;Impaired balance (sitting and/or standing);Impaired vision/perception;Decreased coordination;Decreased cognition;Decreased safety awareness;Decreased knowledge of use of DME or AE;Decreased knowledge of precautions;Impaired UE functional use;Pain   OT Treatment/Interventions: Self-care/ADL training;Therapeutic exercise;Neuromuscular education;Energy conservation;DME and/or AE instruction;Therapeutic activities;Cognitive remediation/compensation;Visual/perceptual remediation/compensation;Patient/family education;Balance training    OT Goals(Current goals can be found in the care plan section) Acute Rehab OT Goals Patient Stated Goal: get food OT Goal Formulation: With patient Time For Goal Achievement: 08/20/15 Potential to Achieve Goals: Good ADL Goals Pt Will Perform Eating: with modified independence;sitting Pt Will Perform Grooming: with min guard assist;standing Pt Will Perform Upper Body Bathing: with min guard assist;sitting Pt Will Perform Lower Body Bathing: with min guard assist;sit to/from stand Pt Will Transfer to Toilet: with min guard assist;ambulating;regular height toilet Pt Will Perform Toileting - Clothing Manipulation and hygiene: with min guard assist;sit to/from stand Pt/caregiver will Perform Home Exercise Program: Increased strength;Both right and left upper extremity;With theraband;With Supervision;With written HEP provided  OT Frequency: Min 2X/week   Barriers to D/C:            Co-evaluation              End of Session    Activity Tolerance: Patient limited by lethargy Patient left: in bed;with call bell/phone within reach;with bed alarm  set;with family/visitor present   Time: 1541-1601 OT Time Calculation (min): 20 min  Charges:  OT General Charges $OT Visit: 1 Procedure OT Evaluation $OT Eval Moderate Complexity: 1 Procedure G-Codes: OT G-codes **NOT FOR INPATIENT CLASS** Functional Assessment Tool Used: Clinical judgement Functional Limitation: Self care Self Care Current Status (I6962(G8987): At least 20 percent but less than 40 percent impaired, limited or restricted Self Care Goal Status ([id number](G8988): At least 1 percent but less than 20 percent impaired, limited or restricted   Gaye AlkenBailey A Shealeigh Dunstan M.S., OTR/L Pager: [phone]  08/06/2015, 4:19 PM

## 2015-08-06 NOTE — Progress Notes (Signed)
Subjective: Patient seen during rounds this morning. His food tray was knocked off his table and on top of his legs. He is lethargic and speaks slowly and slow to respond. He knows the year is 2017, [place], that he is in a medical building. He denies any pain but says he is feeling "rough." He is unable to raise his hands off the table. MRI lumbar spine was not able to be obtained due to movement even after IV ativan.  Objective: Vital signs in last 24 hours: Filed Vitals:   08/05/15 1100 08/05/15 1816 08/05/15 2211 08/06/15 0619  BP: 162/102 162/94 157/95 147/85  Pulse: 74 77 77 78  Temp:  98.6 F (37 C) 98.4 F (36.9 C) 98.5 F (36.9 C)  TempSrc:      Resp: 14 20 18 18   Height:  5\' 10"  (1.778 m)    Weight:  138 lb 9.6 oz (62.869 kg)    SpO2: 100% 100% 100% 100%   Weight change:   Intake/Output Summary (Last 24 hours) at 08/06/15 1122 Last data filed at 08/06/15 0454  Gross per 24 hour  Intake    300 ml  Output    700 ml  Net   -400 ml   General: thin chronically ill appearing man, resting in bed HEENT:no scleral icterus Cardiac: RRR, no rubs, murmurs or gallops Pulm: clear to auscultation anteriorly Abd: soft, nontender, nondistended, BS present GU: Condom cath in place Ext: unable to raise his arms off the bed on his own and will not keep arms up after assist, thin extremities, warm and well perfused, no pedal edema Neuro: alert and oriented X3, lethargic, slow response and speech   Assessment/Plan: Principal Problem:   Acute encephalopathy Active Problems:   Diabetes mellitus without complication (HCC)   Elevated transaminase level   Altered mental status   Midline back pain   Acute encephalopathy: Patient with history of liver disease and U/S findings suggestive of some degree of cirrhosis. Unable to assess for asterixis during exam today, but reportedly had this on admission yesterday. He continues to be lethargic with slow speech and  response time and generalized weakness. Would favor hepatic encephalopathy at this time. He is currently afebrile and without leukocytosis. CT head was without any acute changes. Will try to obtain an MRI brain for further evaluation. -Continue Lactulose 30 mg TID, titrate to 3 bowel movements per day -Add Rifaximin 550 mg po BID -MRI Brain -f/u blood cultures -CBC, CMP -f/u hepatitis panel -Monitor mental and respiratory status  Lumber disease: Patient admitted at [facility] earlier this month with concern for lumbar osteomyelitis. Biopsy was obtained at that time. Records requested and obtained. Biopsy results were negative for acute osteomyelitis, granulomatous disease, or malignancy. He was not requiring antibiotics during that admission or on discharge. MRI lumbar spine was not able to be obtained today due to movement even after given Ativan. He is afebrile and without leukocytosis and not complaining of back pain at this time. Will hold off on antibiotics. -Obtain MRI lumbar spine if able  Severe Erosive Esophagitis: [facility] this month. EGD on 07/12/15 showed severe erosive esophagitis. He was started on oral PPI on discharge. -Avoid NSAIDs -Protonix 40 mg BID  Deconditioning: Patient very deconditioned with generalized weakness. -PT/OT eval, assistance appreciated   Dispo: Disposition is deferred at this time, awaiting improvement of current medical problems.  Anticipated discharge in approximately 1-3 day(s).   The  patient does have a current PCP Henry Solomon(Henry Ann Placey, NP) and [facility] after discharge.  The patient does have transportation limitations that hinder transportation to clinic appointments.      Henry McleanVishal Auna Mikkelsen, MD 08/06/2015, 11:22 AM

## 2015-08-06 NOTE — Evaluation (Signed)
Physical Therapy Evaluation Patient Details Name: Henry Solomon MRN: [id number] DOB: 1960/11/08 Today's Date: 08/06/2015   History of Present Illness  pt is a 55 y/o male with pmh of DM, HTN, HEP B, and chronic back pain, admitted to the ED by his Mom with confusion and even inability to feed himself over the past several days.  [facility] in [place] for back pain and ?osteomyelitiis which was supposedly ruled out.  CT showing sclerotic changes in L3,4,5 incl changes in disc spaces at L34, L45,  and L5S1.  Work up including discitis, osteo and gout.  MRI pending.  Clinical Impression  Pt admitted with/for confusion and inability to feed himself.  Workup including discitis, osteo and gout. .  Pt currently limited functionally due to the problems listed below.  (see problems list.)  Pt will benefit from PT to maximize function and safety to be able to get home safely with available assist of family.     Follow Up Recommendations Home health PT;Supervision for mobility/OOB    Equipment Recommendations       Recommendations for Other Services       Precautions / Restrictions Precautions Precautions: Fall;Back      Mobility  Bed Mobility Overal bed mobility: Needs Assistance Bed Mobility: Supine to Sit;Sit to Supine     Supine to sit: Min assist Sit to supine: Min assist   General bed mobility comments: cues to guidance, improved as he became more aroused.  Transfers Overall transfer level: Needs assistance Equipment used: 1 person hand held assist Transfers: Sit to/from UGI CorporationStand;Stand Pivot Transfers Sit to Stand: Min guard Stand pivot transfers: Min guard       General transfer comment: cues and assist for guidance  Ambulation/Gait Ambulation/Gait assistance: Min assist Ambulation Distance (Feet): 150 Feet Assistive device: 1 person hand held assist Gait Pattern/deviations: Step-through pattern     General Gait Details: mildly  unsteady and hunched over throughout, cues needed for direction  Stairs            Wheelchair Mobility    Modified Rankin (Stroke Patients Only)       Balance Overall balance assessment: Needs assistance Sitting-balance support: Single extremity supported;Bilateral upper extremity supported Sitting balance-Leahy Scale: Fair Sitting balance - Comments: fall   Standing balance support: Single extremity supported;Bilateral upper extremity supported Standing balance-Leahy Scale: Poor                               Pertinent Vitals/Pain Pain Assessment: 0-10 Pain Score: 3  Pain Location: back Pain Descriptors / Indicators: Aching;Grimacing Pain Intervention(s): Monitored during session;Repositioned    Home Living Family/patient expects to be discharged to:: Private residence Living Arrangements: Parent Available Help at Discharge: Family;Available 24 hours/day (pt can stay with Mom until he can go back to [place]) Type of Home: House Home Access: Stairs to enter Entrance Stairs-Rails: Doctor, general practiceight;Left Entrance Stairs-Number of Steps: several Home Layout: One level Home Equipment: Walker - 2 wheels;Cane - single point      Prior Function Level of Independence: Independent with assistive device(s)         Comments: It's been getting more difficult     Hand Dominance        Extremity/Trunk Assessment   Upper Extremity Assessment: Defer to OT evaluation           Lower Extremity Assessment: Generalized weakness;Difficult to assess due to impaired cognition  Communication   Communication: No difficulties  Cognition Arousal/Alertness: Lethargic Behavior During Therapy: Flat affect Overall Cognitive Status: Impaired/Different from baseline Area of Impairment: Orientation;Following commands;Safety/judgement;Awareness;Problem solving Orientation Level: Place;Time     Following Commands: Follows one step commands  inconsistently Safety/Judgement: Decreased awareness of safety;Decreased awareness of deficits Awareness: Intellectual Problem Solving: Slow processing;Decreased initiation      General Comments General comments (skin integrity, edema, etc.): Pt is lethargic, likely somewhat encephalopathic and difficult to get history from..    Exercises        Assessment/Plan    PT Assessment Patient needs continued PT services  PT Diagnosis Difficulty walking;Generalized weakness;Acute pain   PT Problem List Decreased strength;Decreased activity tolerance;Decreased balance;Decreased mobility;Decreased coordination;Decreased knowledge of use of DME;Decreased knowledge of precautions;Pain  PT Treatment Interventions DME instruction;Gait training;Stair training;Functional mobility training;Therapeutic activities;Balance training;Patient/family education   PT Goals (Current goals can be found in the Care Plan section) Acute Rehab PT Goals Patient Stated Goal: pt unable PT Goal Formulation: With patient/family Time For Goal Achievement: 08/20/15 Potential to Achieve Goals: Fair    Frequency Min 3X/week   Barriers to discharge        Co-evaluation               End of Session   Activity Tolerance: Patient tolerated treatment well;Patient limited by lethargy;Patient limited by fatigue Patient left: in bed;with call bell/phone within reach;with bed alarm set Nurse Communication: Mobility status    Functional Assessment Tool Used: clinical judgement Functional Limitation: Mobility: Walking and moving around Mobility: Walking and Moving Around Current Status ([id number](G8978): At least 20 percent but less than 40 percent impaired, limited or restricted Mobility: Walking and Moving Around Goal Status [phone](G8979): At least 1 percent but less than 20 percent impaired, limited or restricted    Time: 1333-1403 PT Time Calculation (min) (ACUTE ONLY): 30 min   Charges:   PT Evaluation $PT Eval Moderate  Complexity: 1 Procedure PT Treatments $Gait Training: 8-22 mins   PT G Codes:   PT G-Codes **NOT FOR INPATIENT CLASS** Functional Assessment Tool Used: clinical judgement Functional Limitation: Mobility: Walking and moving around Mobility: Walking and Moving Around Current Status ([id number](G8978): At least 20 percent but less than 40 percent impaired, limited or restricted Mobility: Walking and Moving Around Goal Status [phone](G8979): At least 1 percent but less than 20 percent impaired, limited or restricted    Patrica Mendell, Eliseo GumKenneth V 08/06/2015, 2:27 PM 08/06/2015  Eaton BingKen Nataliee Shurtz, PT [phone]  (pager)

## 2015-08-06 NOTE — Progress Notes (Signed)
   08/06/15 0900  Clinical Encounter Type  Visited With Patient;Health care provider  Visit Type Follow-up;Spiritual support  Referral From Nurse  Consult/Referral To Faith community  Spiritual Encounters  Spiritual Needs Prayer  Stress Factors  Patient Stress Factors Exhausted;Loss of control  Family Stress Factors None identified   Chaplain visited with patient per consult.  Chaplain able to pray with patient and offered ministry of presence.  Rosezella FloridaLisa M Eye Surgery Center Of The CarolinasNyabinghi 08/06/2015 9:46 AM

## 2015-08-07 ENCOUNTER — Observation Stay (HOSPITAL_COMMUNITY): Payer: Medicaid Other

## 2015-08-07 DIAGNOSIS — K2210 Ulcer of esophagus without bleeding: Secondary | ICD-10-CM | POA: Diagnosis not present

## 2015-08-07 DIAGNOSIS — M488X6 Other specified spondylopathies, lumbar region: Secondary | ICD-10-CM | POA: Diagnosis not present

## 2015-08-07 DIAGNOSIS — K7290 Hepatic failure, unspecified without coma: Secondary | ICD-10-CM | POA: Diagnosis not present

## 2015-08-07 DIAGNOSIS — E440 Moderate protein-calorie malnutrition: Secondary | ICD-10-CM | POA: Insufficient documentation

## 2015-08-07 DIAGNOSIS — R9431 Abnormal electrocardiogram [ECG] [EKG]: Secondary | ICD-10-CM | POA: Insufficient documentation

## 2015-08-07 DIAGNOSIS — B181 Chronic viral hepatitis B without delta-agent: Secondary | ICD-10-CM | POA: Diagnosis not present

## 2015-08-07 DIAGNOSIS — R5381 Other malaise: Secondary | ICD-10-CM | POA: Diagnosis not present

## 2015-08-07 LAB — COMPREHENSIVE METABOLIC PANEL
ALBUMIN: 1.8 g/dL — AB (ref 3.5–5.0)
ALK PHOS: 145 U/L — AB (ref 38–126)
ALT: 91 U/L — AB (ref 17–63)
ANION GAP: 9 (ref 5–15)
AST: 129 U/L — ABNORMAL HIGH (ref 15–41)
BILIRUBIN TOTAL: 0.6 mg/dL (ref 0.3–1.2)
BUN: 18 mg/dL (ref 6–20)
CALCIUM: 8.7 mg/dL — AB (ref 8.9–10.3)
CO2: 21 mmol/L — AB (ref 22–32)
CREATININE: 1.66 mg/dL — AB (ref 0.61–1.24)
Chloride: 106 mmol/L (ref 101–111)
GFR calc Af Amer: 52 mL/min — ABNORMAL LOW (ref >60)
GFR calc non Af Amer: 45 mL/min — ABNORMAL LOW (ref >60)
GLUCOSE: 353 mg/dL — AB (ref 65–99)
Potassium: 4.1 mmol/L (ref 3.5–5.1)
SODIUM: 136 mmol/L (ref 135–145)
TOTAL PROTEIN: 6.1 g/dL — AB (ref 6.5–8.1)

## 2015-08-07 LAB — GLUCOSE, CAPILLARY
Glucose-Capillary: 445 mg/dL — ABNORMAL HIGH (ref 65–99)
Glucose-Capillary: 245 mg/dL — ABNORMAL HIGH (ref 65–99)
Glucose-Capillary: 393 mg/dL — ABNORMAL HIGH (ref 65–99)
Glucose-Capillary: 187 mg/dL — ABNORMAL HIGH (ref 65–99)

## 2015-08-07 LAB — CBC
HEMATOCRIT: 40.3 % (ref 39.0–52.0)
HEMOGLOBIN: 13.4 g/dL (ref 13.0–17.0)
MCH: 30.7 pg (ref 26.0–34.0)
MCHC: 33.3 g/dL (ref 30.0–36.0)
MCV: 92.2 fL (ref 78.0–100.0)
Platelets: 99 10*3/uL — ABNORMAL LOW (ref 150–400)
RBC: 4.37 MIL/uL (ref 4.22–5.81)
RDW: 13.7 % (ref 11.5–15.5)
WBC: 6.3 10*3/uL (ref 4.0–10.5)

## 2015-08-07 MED ORDER — GLUCERNA SHAKE PO LIQD
237.0000 mL | Freq: Three times a day (TID) | ORAL | Status: DC
  Administered 2015-08-07 – 2015-08-09 (×5): 237 mL via ORAL

## 2015-08-07 MED ORDER — INSULIN GLARGINE 100 UNIT/ML ~~LOC~~ SOLN
5.0000 [IU] | Freq: Every day | SUBCUTANEOUS | Status: DC
  Administered 2015-08-07 – 2015-08-08 (×2): 5 [IU] via SUBCUTANEOUS
  Filled 2015-08-07 (×2): qty 0.05

## 2015-08-07 MED ORDER — LORAZEPAM 2 MG/ML IJ SOLN
0.5000 mg | Freq: Once | INTRAMUSCULAR | Status: DC
  Filled 2015-08-07: qty 1

## 2015-08-07 NOTE — Progress Notes (Signed)
Initial Nutrition Assessment  DOCUMENTATION CODES:  Non-severe (moderate) malnutrition in context of chronic illness   Pt meets criteria for MODERATE MALNUTRITION in the context of  as evidenced by Moderate muscle wasting and mild fat depletion.  INTERVENTION:  Glucerna Shake po TID, each supplement provides 220 kcal and 10 grams of protein  NUTRITION DIAGNOSIS:  Increased nutrient needs related to chronic illness (liver disease) as evidenced by estimated nutritional requirements for this conditon  GOAL:  Patient will meet greater than or equal to 90% of their needs  MONITOR:  PO intake, Supplement acceptance, Labs, Weight trends (Better history)  REASON FOR ASSESSMENT:  Consult Assessment of nutrition requirement/status  ASSESSMENT:  55 y/o Male PMhx DM and recent osteomyelitis. Presents with confusion and the inability to feed himself. Work up reveals elevated Liver enzymes and US findings suggest cirrhosis and encephalopathy of hepatic origin. CT shows degenerative changes of lumbar spine.   Pt's encephalopathy appears to have much improved as he was conversative today. Still appeared to have some mild confusion vs being poor historian.   When asked about his appetite PTA, he said he wasn't eating well because he was on a full liquid diet. Believe he was referring to his recent hospital admission. He then stated he wasn't eating well due to his infection.   At home, he says he took a daily MVI. He has had glucerna in the past and likes them. He would like them now.   Nurses in room report pt's appetite has returned and has been eating extremely well. He ate all of his breakfast and ate a large dinner last night. His family apparently brought him a very large dinner consisting of large amounts of carbohydrates and this is thought to be reason for hyperglycemia.   Pt at home says he checked his BG, not not frequently. When he checked he says it was ~170.   He asked for a burger  and fries tonight for dinner  Pt was confused about wt history. He says he lost weight, but then states "I usually am 140 lbs, but now im 150 lbs". Unsure how accurate his UBW report is.   Has mild to moderate muscle wasting, specifically temporal/clavicle musculature. Mild tricep fat depletion.   Labs reviewed: A1C pending, Albumin: 1.8, ALT/AST:91/129, CBGs increasing 161>096>[id number]>353>445   Recent Labs Lab 08/05/15 0802 08/06/15 0513 08/07/15 0446  NA 138 135 136  K 4.6 4.7 4.1  CL 111 106 106  CO2 19* 20* 21*  BUN 21* 21* 18  CREATININE 1.60* 1.41* 1.66*  CALCIUM 9.1 8.9 8.7*  GLUCOSE 188* 304* 353*   Diet Order:  Diet heart healthy/carb modified Room service appropriate?: Yes; Fluid consistency:: Thin  Skin:  Reviewed, no issues  Last BM:  6/30  Height:  Ht Readings from Last 1 Encounters:  08/05/15 5\' 10"  (1.778 m)   Weight:  Wt Readings from Last 1 Encounters:  08/05/15 138 lb 9.6 oz (62.869 kg)  no weight history available  Ideal Body Weight:  75.45 kg  BMI:  Body mass index is 19.89 kg/(m^2).  Estimated Nutritional Needs:  Kcal:  2000-2200 (32-35 kcal/kg bw) Protein:  88-101 g Pro (1.4-1.6 g/kg BW) Fluid:  >2.2 liters  EDUCATION NEEDS:  No education needs identified at this time  Christophe LouisNathan Tvisha Schwoerer RD, LDN, CNSC Clinical Nutrition Pager: [phone] 08/07/2015 1:33 PM

## 2015-08-07 NOTE — Progress Notes (Signed)
Subjective: No acute events overnight. Unable to obtain MRI brain and lumbar spine yesterday due to patient being uncooperative. He is much more alert and oriented to person and place this morning. He is able to tell me the President's name. He denies any pain. He states he will be able to cooperate for the MRI today.   Objective: Vital signs in last 24 hours: Filed Vitals:   08/06/15 0619 08/06/15 1422 08/06/15 2209 08/07/15 0552  BP: 147/85 162/90 116/81 141/76  Pulse: 78 88 89 75  Temp: 98.5 F (36.9 C) 98.5 F (36.9 C) 98.7 F (37.1 C) 98.5 F (36.9 C)  TempSrc:  Oral Oral Oral  Resp: 18 20 18 16   Height:      Weight:      SpO2: 100% 100% 100% 100%   Weight change:   Intake/Output Summary (Last 24 hours) at 08/07/15 0813 Last data filed at 08/06/15 2200  Gross per 24 hour  Intake    300 ml  Output      0 ml  Net    300 ml  Physical Exam Gen: thin chronically ill appearing man, resting in bed HEENT: [place]/AT, EOMI, sclera anicteric, mucus membranes moist CV: RRR, no m/g/r Pulm: CTA bilaterally, breaths non-labored  Abd: BS+, soft, nontender, nondistended GU: Condom cath in place Ext: warm, no peripheral edema. He is able to raise both legs off the bed. Asterixis present.  Neuro: alert and oriented X2. Lethargic, slow response and speech. He is following most commands which he was not able to do yesterday. He could not follow my finger with his eyes but was able to do finger to nose, more accurate on right side.    Assessment/Plan:  Acute encephalopathy: Patient with history of liver disease and U/S findings suggestive of some degree of cirrhosis. His mental status has improved greatly compared to yesterday after starting lactulose and rifaximin. He is able to have a normal conversation and follow most commands, but his speech remains slow which is expected with hepatic encephalopathy. He has asterixis on exam today. CT head was without any acute changes. I think we can  hold off on an MRI brain since he is improving with rifaximin and lactulose and no focal deficits on exam. - Continue Lactulose 30 mg TID, titrate to 3 bowel movements per day - Continue Rifaximin 550 mg po BID - f/u blood cultures>> negative to date - CBC, CMP in AM - f/u hepatitis panel>> Hep B surface antigen positive, will obtain Hep B surface antibody and Hep B core antibody to evaluate status - Monitor mental status  Lumber disease: Patient admitted at [facility] earlier this month with concern for lumbar osteomyelitis. Biopsy was obtained at that time and were negative for acute osteomyelitis, granulomatous disease, or malignancy. He was not requiring antibiotics during that admission or on discharge. MRI lumbar spine was not able to be obtained yesterday due to movement even after given Ativan. He is afebrile and without leukocytosis and not complaining of back pain at this time. Will hold off on antibiotics. - Obtain MRI lumbar spine if able  Severe Erosive Esophagitis: [facility] this month. EGD on 07/12/15 showed severe erosive esophagitis. He was started on oral PPI on discharge. - Avoid NSAIDs - Protonix 40 mg BID  Deconditioning: Patient very deconditioned with generalized weakness. - PT/OT eval, assistance appreciated  Diet: Heart healthy VTE PPx: SCDs Dispo: Disposition is deferred at this time, awaiting improvement of current medical  problems.  Anticipated discharge in approximately 1-3 day(s).   The patient does have a current PCP [name]([name], NP) and [facility] after discharge.  The patient does have transportation limitations that hinder transportation to clinic appointments.      Su Hoffarly J Tamey Wanek, MD 08/07/2015, 8:13 AM

## 2015-08-08 DIAGNOSIS — K7290 Hepatic failure, unspecified without coma: Secondary | ICD-10-CM | POA: Diagnosis not present

## 2015-08-08 DIAGNOSIS — B181 Chronic viral hepatitis B without delta-agent: Secondary | ICD-10-CM | POA: Insufficient documentation

## 2015-08-08 DIAGNOSIS — K2210 Ulcer of esophagus without bleeding: Secondary | ICD-10-CM | POA: Diagnosis not present

## 2015-08-08 DIAGNOSIS — M488X6 Other specified spondylopathies, lumbar region: Secondary | ICD-10-CM | POA: Diagnosis not present

## 2015-08-08 DIAGNOSIS — K7460 Unspecified cirrhosis of liver: Secondary | ICD-10-CM | POA: Insufficient documentation

## 2015-08-08 DIAGNOSIS — K7682 Hepatic encephalopathy: Secondary | ICD-10-CM | POA: Insufficient documentation

## 2015-08-08 LAB — GLUCOSE, CAPILLARY
GLUCOSE-CAPILLARY: 440 mg/dL — AB (ref 65–99)
GLUCOSE-CAPILLARY: 325 mg/dL — AB (ref 65–99)
GLUCOSE-CAPILLARY: 425 mg/dL — AB (ref 65–99)
Glucose-Capillary: 112 mg/dL — ABNORMAL HIGH (ref 65–99)

## 2015-08-08 LAB — COMPREHENSIVE METABOLIC PANEL
ALBUMIN: 2 g/dL — AB (ref 3.5–5.0)
ALT: 142 U/L — ABNORMAL HIGH (ref 17–63)
ANION GAP: 8 (ref 5–15)
AST: 215 U/L — AB (ref 15–41)
Alkaline Phosphatase: 154 U/L — ABNORMAL HIGH (ref 38–126)
BUN: 15 mg/dL (ref 6–20)
CO2: 22 mmol/L (ref 22–32)
Calcium: 9 mg/dL (ref 8.9–10.3)
Chloride: 105 mmol/L (ref 101–111)
Creatinine, Ser: 1.38 mg/dL — ABNORMAL HIGH (ref 0.61–1.24)
GFR calc Af Amer: >60 mL/min (ref >60)
GFR calc non Af Amer: 57 mL/min — ABNORMAL LOW (ref >60)
GLUCOSE: 106 mg/dL — AB (ref 65–99)
POTASSIUM: 4.3 mmol/L (ref 3.5–5.1)
SODIUM: 135 mmol/L (ref 135–145)
Total Bilirubin: 0.7 mg/dL (ref 0.3–1.2)
Total Protein: 6.4 g/dL — ABNORMAL LOW (ref 6.5–8.1)

## 2015-08-08 LAB — HEPATITIS B CORE ANTIBODY, TOTAL: HEP B C TOTAL AB: POSITIVE — AB

## 2015-08-08 LAB — HEPATITIS B SURFACE ANTIBODY,QUALITATIVE: HEP B S AB: REACTIVE

## 2015-08-08 NOTE — Progress Notes (Signed)
Subjective: No acute events overnight. Patient still confused this morning. Oriented to person and place but not year or month. Denies any abdominal pain, nausea. He was trying to drink from a bowl containing sugar packets and unable to feed himself properly with a fork (also trying to use knife).   Objective: Vital signs in last 24 hours: Filed Vitals:   08/07/15 0552 08/07/15 1252 08/07/15 2109 08/08/15 0703  BP: 141/76 152/99 151/86 142/79  Pulse: 75 80 99 87  Temp: 98.5 F (36.9 C) 98.5 F (36.9 C) 98.5 F (36.9 C) 98 F (36.7 C)  TempSrc: Oral  Oral   Resp: 16 19 18    Height:      Weight:      SpO2: 100% 100% 100% 100%   Weight change:   Intake/Output Summary (Last 24 hours) at 08/08/15 1031 Last data filed at 08/07/15 1618  Gross per 24 hour  Intake    600 ml  Output    400 ml  Net    200 ml  Physical Exam Gen: thin chronically ill appearing man, sitting up in chair attempting to eat breakfast HEENT: [place]/AT, EOMI, sclera anicteric, mucus membranes moist CV: RRR, no m/g/r Pulm: CTA bilaterally, breaths non-labored  Abd: BS+, soft, nontender, nondistended Ext: warm, no peripheral edema Neuro: alert and oriented X2. Lethargic, slow response and speech. Follows commands.    Assessment/Plan:  Hepatic encephalopathy: Patient with history of liver disease and U/S findings suggestive of some degree of cirrhosis. His mental status has improved since admission, but still confused this morning as evidenced by him drinking inappropriately from bowl that did not contain liquid. Will continue lactulose and rifaximin with titration to 3 bowel movements daily. Re-evaluate his mental status tomorrow and hopefully will be able to go home.  - Continue Lactulose 30 mg TID, titrate to 3 bowel movements per day - Continue Rifaximin 550 mg po BID - Will need CMET as outpatient  Chronic hepatitis B infection: His serum Hep B surface antigen was positive, Hep B surface antibody positive,  Hep B core total antibody positive, and Hep B core IgM antibody negative. These results are consistent with a chronic hepatitis B infection. He is not in acute liver failure. His ALT has mildly trended up from admission from 84>82>91. Treatment for chronic hepatitis B is indictaed in patients with cirrhosis at any ALT level when the HBV DNA is detectable at a level >2000 or if he has decompensated liver disease. If not elevated, likely can just observe closely. He will need follow up in [place] where he is from. Will obtain a HBV DNA level to determine the next course of action. - f/u HBV DNA  Lumbar disease: [facility] Unable to obtain MRI lumbar spine here as patient uncooperative. He is not complaining of back pain and given negative bone biopsy have very low suspicion for infection in setting of no fevers and normal WBC count.   Severe Erosive Esophagitis: [facility]. EGD on 07/12/15 showed severe erosive esophagitis. He was started on oral PPI on discharge. - Avoid NSAIDs - Protonix 40 mg BID  Deconditioning: Patient very deconditioned with generalized weakness. - PT/OT eval, assistance appreciated - Will order home health PT/OT on discharge   Diet: Heart healthy VTE PPx: SCDs Dispo: Disposition is deferred at this time, awaiting improvement of current medical problems.  Anticipated discharge in approximately 1-2 day(s).   The patient does  have a current PCP [name]([name], NP) and does need [facility] follow-[facility] appointment after discharge.  The patient does have transportation limitations that hinder transportation to clinic appointments.      Su Hoffarly J Rivet, MD 08/08/2015, 10:31 AM

## 2015-08-09 DIAGNOSIS — K7290 Hepatic failure, unspecified without coma: Secondary | ICD-10-CM | POA: Diagnosis not present

## 2015-08-09 LAB — GLUCOSE, CAPILLARY
GLUCOSE-CAPILLARY: 103 mg/dL — AB (ref 65–99)
Glucose-Capillary: 517 mg/dL (ref 65–99)
Glucose-Capillary: 205 mg/dL — ABNORMAL HIGH (ref 65–99)

## 2015-08-09 LAB — HEMOGLOBIN A1C
HEMOGLOBIN A1C: 11.7 % — AB (ref 4.8–5.6)
Mean Plasma Glucose: 289 mg/dL

## 2015-08-09 MED ORDER — INSULIN ASPART 100 UNIT/ML ~~LOC~~ SOLN
3.0000 [IU] | Freq: Three times a day (TID) | SUBCUTANEOUS | Status: DC
  Administered 2015-08-09 (×2): 3 [IU] via SUBCUTANEOUS

## 2015-08-09 MED ORDER — INSULIN ASPART 100 UNIT/ML ~~LOC~~ SOLN
0.0000 [IU] | Freq: Three times a day (TID) | SUBCUTANEOUS | Status: DC
  Administered 2015-08-09: 5 [IU] via SUBCUTANEOUS

## 2015-08-09 MED ORDER — THIAMINE HCL 100 MG PO TABS: 100.0000 mg | ORAL_TABLET | Freq: Every day | ORAL | Status: DC

## 2015-08-09 MED ORDER — INSULIN GLARGINE 100 UNIT/ML ~~LOC~~ SOLN
8.0000 [IU] | Freq: Every day | SUBCUTANEOUS | Status: DC
  Filled 2015-08-09: qty 0.08

## 2015-08-09 MED ORDER — FOLIC ACID 1 MG PO TABS: 1.0000 mg | ORAL_TABLET | Freq: Every day | ORAL | Status: DC

## 2015-08-09 MED ORDER — RIFAXIMIN 550 MG PO TABS: 550.0000 mg | ORAL_TABLET | Freq: Two times a day (BID) | ORAL | Status: DC

## 2015-08-09 MED ORDER — LACTULOSE 10 GM/15ML PO SOLN: 30.0000 g | Freq: Three times a day (TID) | ORAL | Status: DC

## 2015-08-09 MED ORDER — INSULIN ASPART 100 UNIT/ML ~~LOC~~ SOLN: 0.0000 [IU] | Freq: Every day | SUBCUTANEOUS | Status: DC

## 2015-08-09 MED ORDER — GLUCERNA SHAKE PO LIQD
237.0000 mL | Freq: Three times a day (TID) | ORAL | Status: AC
Start: 2015-08-09 — End: ?

## 2015-08-09 NOTE — Progress Notes (Signed)
Internal Medicine Attending  Date: 08/09/2015  Patient name: Daryel NovemberScottie L Brandy Medical record number: [id number] Date of birth: Nov 01, 1960 Age: 55 y.o. Gender: male  I saw and evaluated the patient. I reviewed the resident's note by Dr. Mikey BussingHoffman and I agree with the resident's findings and plans as documented in her progress note.  When seen on rounds this morning Mr. [name] was much more alert and oriented than yesterday. He was able to answer questions appropriately, and per Dr. Larena Soxivet's teach back, he correctly articulated that he had to have 3 bowel movements a day in order to prevent further confusion. Examination was without asterixis. I agree with the plan to discharge to his mother's care for now while he makes the move back to [place] which is his ultimate plan in the very near future. Continued management of his hepatic encephalopathy on the lactulose and rifaximin will be required by his PCP. His primary care provider in [place] will also have to address his chronic hepatitis B infection.

## 2015-08-09 NOTE — Discharge Instructions (Signed)
Henry Solomon It was a pleasure taking care of you.  During your hospital stay you were admitted for Acute Encephalopathy.  The following are instructions for home: - Continue to take Lactulose 30g three times a day and you NEED to have 3 BOWEL MOVEMENTS.  If you are having more than 3 then you may take the Lactulose 30g two times a day.  The goal is to have a minimum of 3 Bowel movements a day. - Continue Rifaximin 550mg  2 times a day - You need to follow up with your primary doctor for blood work - You need to follow up with your primary doctor for HBV DNA to assess if you need treatment for your chronic Hepatitis B

## 2015-08-09 NOTE — Progress Notes (Signed)
Nsg Discharge Note  Admit Date:  08/05/2015 Discharge date: 08/09/2015   Jheremy L Desmith to be D/C'd Home with Home health per MD order.  AVS completed.  Copy for chart, and copy for patient signed, and dated. Patient/caregiver able to verbalize understanding.  Discharge Medication:   Medication List    TAKE these medications        amLODipine 5 MG tablet  Commonly known as:  NORVASC  Take 5 mg by mouth daily.     CAL-MAG-ZINC PO  Take 1 tablet by mouth daily.     cholecalciferol 1000 units tablet  Commonly known as:  VITAMIN D  Take 1,000 Units by mouth daily.     feeding supplement (GLUCERNA SHAKE) Liqd  Take 237 mLs by mouth 3 (three) times daily between meals.     FLAX SEEDS PO  Take 1 capsule by mouth daily.     folic acid 1 MG tablet  Commonly known as:  FOLVITE  Take 1 tablet (1 mg total) by mouth daily.     gabapentin 100 MG capsule  Commonly known as:  NEURONTIN  Take 100 mg by mouth at bedtime.     glipiZIDE 5 MG tablet  Commonly known as:  GLUCOTROL  Take 5 mg by mouth 2 (two) times daily before a meal.     lactulose 10 GM/15ML solution  Commonly known as:  CHRONULAC  Take 45 mLs (30 g total) by mouth 3 (three) times daily.     pantoprazole 40 MG tablet  Commonly known as:  PROTONIX  Take 40 mg by mouth 2 (two) times daily.     rifaximin 550 MG Tabs tablet  Commonly known as:  XIFAXAN  Take 1 tablet (550 mg total) by mouth 2 (two) times daily.     thiamine 100 MG tablet  Take 1 tablet (100 mg total) by mouth daily.     vitamin B-12 1000 MCG tablet  Commonly known as:  CYANOCOBALAMIN  Take 1,000 mcg by mouth daily.        Discharge Assessment: Filed Vitals:   08/08/15 2129 08/09/15 0507  BP: 154/80 137/80  Pulse: 100 76  Temp: 100 F (37.8 C) 98.6 F (37 C)  Resp: 18 19   Skin clean, dry and intact without evidence of skin break down, no evidence of skin tears noted. IV catheter discontinued intact. Site without signs and symptoms of  complications - no redness or edema noted at insertion site, patient denies c/o pain - only slight tenderness at site.  Dressing with slight pressure applied.  D/c Instructions-Education: Discharge instructions given to patient/family with verbalized understanding. D/c education completed with patient/family including follow up instructions, medication list, d/c activities limitations if indicated, with other d/c instructions as indicated by MD - patient able to verbalize understanding, all questions fully answered. Patient instructed to return to ED, call 911, or call MD for any changes in condition.  Patient escorted via WC, and D/C home via Bus.  Kern ReapBrumagin, Vinie Charity L, RN 08/09/2015 4:26 PM

## 2015-08-09 NOTE — Discharge Summary (Addendum)
Name: Henry Solomon MRN: [id number] DOB: 1960-03-07 55 y.o. PCP: Henry SharpsMary Ann Placey, NP   Date of Admission: 08/05/2015  6:52 AM Date of Discharge: 08/10/2015 Attending Physician: No att. providers found  Discharge Diagnosis:  Acute Encephalopathy    Discharge Medications:   Medication List    TAKE these medications        amLODipine 5 MG tablet  Commonly known as:  NORVASC  Take 5 mg by mouth daily.     CAL-MAG-ZINC PO  Take 1 tablet by mouth daily.     cholecalciferol 1000 units tablet  Commonly known as:  VITAMIN D  Take 1,000 Units by mouth daily.     feeding supplement (GLUCERNA SHAKE) Liqd  Take 237 mLs by mouth 3 (three) times daily between meals.     FLAX SEEDS PO  Take 1 capsule by mouth daily.     folic acid 1 MG tablet  Commonly known as:  FOLVITE  Take 1 tablet (1 mg total) by mouth daily.     gabapentin 100 MG capsule  Commonly known as:  NEURONTIN  Take 100 mg by mouth at bedtime.     glipiZIDE 5 MG tablet  Commonly known as:  GLUCOTROL  Take 5 mg by mouth 2 (two) times daily before a meal.     lactulose 10 GM/15ML solution  Commonly known as:  CHRONULAC  Take 45 mLs (30 g total) by mouth 3 (three) times daily.     pantoprazole 40 MG tablet  Commonly known as:  PROTONIX  Take 40 mg by mouth 2 (two) times daily.     rifaximin 550 MG Tabs tablet  Commonly known as:  XIFAXAN  Take 1 tablet (550 mg total) by mouth 2 (two) times daily.     thiamine 100 MG tablet  Take 1 tablet (100 mg total) by mouth daily.     vitamin B-12 1000 MCG tablet  Commonly known as:  CYANOCOBALAMIN  Take 1,000 mcg by mouth daily.        Disposition and follow-up:   [name] was [facility].  At the hospital follow up visit please address:  1.  Assess patient compliance on Lactulose and Rifaximin.  Assess number of bowel movements for goal of 3 daily.  2.  Labs / imaging needed at time of follow-up:  CMP  3.  Pending labs/ test needing follow-up: HBV DNA  Follow-up Appointments: Follow-up Information    [facility].   Why:  Please follow up in one week [facility] [place] [facility]   Contact information:   [address] [place] [address] [phone]       Follow up with [facility].   Why:  rolling walker to be delivered to bedside   Contact information:   [address] [place] [address] [phone]       Follow up with [facility].   Why:  Home health PT and OT arranged   Contact information:   [address] [place] [address] [phone]       Hospital Course by problem list:   Acute encephalopathy - Patient was admitted on 6/29 for a decline in mental status that was observed by the patient's mother.  During admission patient was confused and unable to provide a history.  Asterixis was noted.  Patient was also lethargic and had generalized weakness.  A CT of the head w/o contrast noted no acute abnormality.   A RUQ abdominal ultra sound showed a degree of underlying parenchymal disease, likely a degree of cirrhosis.  Lactulose was titrated to 3-4 bowel movements a day at 30g TID.  Rifaximin was also given 550mg  BID.  Throughout the hospital course patient showed improvement in mental status and was discharged A&O x 3 with no asterixis.     Chronic hepatitis B (HCC) Hepatitis panel was ordered showing chronic Hepatitis B.  [place] is from [place] and is currently living [place] in [place].  Patient will need to follow up with a primary care physician to assess his chronic Hep B.    Lumbar disease Patient has a history of chronic back pain and complained of back pain on admission.  CT imaging of thoracic and lumbar spine was ordered.  Thoracic imaging showed scoliosis and degenerative changes especially involving the facets at multiple levels. There was no  evidence of thoracic spine fracture or infection present. Lumbar imaging showed extensive changes involving the lower 3 disc spaces at L3-4, L4-5, and L5-S1 with disc space narrowing, endplate destruction, and osteophyte formation. There is sclerotic change in the L3, L4, and L5 vertebral bodies.  MRI imaging of lumbar spine was ordered because there was suspicion for lumbar osteomyelitis but unable to complete due to patient non compliance.  During admission back pain improved.  Since patient was afebrile and without leukocytosis infection was not suspected.  Diabetes mellitus without complication (HCC) Type II diabetes managed with sliding scale insulin.    Discharge Vitals:   BP 137/80 mmHg  Pulse 76  Temp(Src) 98.6 F (37 C) (Oral)  Resp 19  Ht 5\' 10"  (1.778 m)  Wt 138 lb 9.6 oz (62.869 kg)  BMI 19.89 kg/m2  SpO2 100%  Pertinent Labs, Studies, and Procedures:  CT of head w/o contrast CT of thoracic and lumbar spine RUQ abdominal ultrasound  Discharge Instructions: Discharge Instructions    Diet - low sodium heart healthy    Complete by:  As directed      Discharge instructions    Complete by:  As directed   [name] It was a pleasure taking care of you.  During your hospital stay you were admitted for Acute Encephalopathy.  The following are instructions for home: - Continue to take Lactulose 30g three times a day and you NEED to have 3 BOWEL MOVEMENTS.  If you are having more than 3 then you may take the Lactulose 30g two times a day.  The goal is to have a minimum of 3 Bowel movements a day. - Continue Rifaximin 550mg  2 times a day - You need to follow up with your primary doctor for blood work - You need to follow up with your primary doctor for HBV DNA to assess if you need treatment for your chronic Hepatitis B     Increase activity slowly    Complete by:  As directed            Signed: Camelia PhenesJessica Ratliff Hoffman, DO 08/10/2015, 9:00 PM   Pager: [phone]  As a  matter of clarification please note that the encephalopathy was specifically hepatic encephalopathy.

## 2015-08-09 NOTE — Progress Notes (Signed)
   Subjective: [name] was seen this morning on rounds.  Patient was sitting up and eating breakfast without difficulty.  Patient is oriented to person, place and time.  Currently patient complains of abdominal pain and having diarrhea with 3-4 bowel movements a day.  Patient was able to ambulate in the room with the assistance of sitter. Patient stated his lower back pain is improving. Patient denies nausea, vomiting, SOB.   Objective: Vital signs in last 24 hours: Filed Vitals:   08/08/15 0703 08/08/15 1700 08/08/15 2129 08/09/15 0507  BP: 142/79 131/70 154/80 137/80  Pulse: 87 100 100 76  Temp: 98 F (36.7 C) 99.3 F (37.4 C) 100 F (37.8 C) 98.6 F (37 C)  TempSrc:      Resp:  20 18 19   Height:      Weight:      SpO2: 100% 100% 98% 100%   Physical Exam Physical Exam  Constitutional: He is oriented to person, place, and time. No distress.  Cardiovascular: Normal rate, regular rhythm and normal heart sounds.   Pulmonary/Chest: Breath sounds normal.  Abdominal: Soft. Bowel sounds are normal. There is no tenderness.  Neurological: He is alert and oriented to person, place, and time.  No asterixis noted on exam   Skin: Skin is warm.    Assessment/Plan:   Acute encephalopathy - Patient is currently taking lactulose 30 g TID and Rifaximin 550mg  BID.  4 bowel movements have been noted each day for the past 2 days. Patient's mental status has improved and is A&O x 3.  Due to improved mental status patient is appropriate for discharge.  [place] in [place] and will be returning to [place]. - Patient will need to follow up with PCP in [place] for repeat CMP - Continue Lactulose 30g TID  - Continue Rifaximin 550mg  BID  Chronic hepatitis B (HCC) - Patient's Hep B panel is suggestive of chronic hepatitis B infection.  HBV DNA results are still pending. - If HBV DNA > 2000 then treatment for HBV is recommended.  Patient will need to follow  up with PCP if this is the case.       Dispo: Anticipated discharge in approximately 0 day(s).     Camelia PhenesJessica Ratliff Ranson Belluomini, DO 08/09/2015, 7:46 AM Pager: [phone]

## 2015-08-09 NOTE — Progress Notes (Signed)
Physical Therapy Treatment Patient Details Name: Henry Solomon MRN: [id number] DOB: 06/02/1960 Today's Date: 08/09/2015    History of Present Illness pt is a 55 y/o male with pmh of DM, HTN, HEP B, and chronic back pain, admitted to the ED by his Mom with confusion and even inability to feed himself over the past several days.  Recently discharged from [facility] in [place] for back pain and ?osteomyelitiis which was supposedly ruled out.  CT showing sclerotic changes in L3,4,5 incl changes in disc spaces at L34, L45,  and L5S1.  Work up including discitis, osteo and gout.  MRI pending.    PT Comments    Progressing well.  Still doesn't make changes to mobility based on statement of therapist and continues on as if he did not hear anything  Follow Up Recommendations  Home health PT;Supervision for mobility/OOB     Equipment Recommendations  Rolling walker with 5" wheels    Recommendations for Other Services       Precautions / Restrictions Precautions Precautions: Fall Restrictions Weight Bearing Restrictions: No    Mobility  Bed Mobility                  Transfers Overall transfer level: Needs assistance Equipment used: None Transfers: Sit to/from Stand Sit to Stand: Modified independent (Device/Increase time)            Ambulation/Gait Ambulation/Gait assistance: Supervision Ambulation Distance (Feet): 400 Feet Assistive device: Rolling walker (2 wheeled) Gait Pattern/deviations: Step-through pattern Gait velocity: moderate Gait velocity interpretation: Below normal speed for age/gender General Gait Details: generally steady using the RW.  Still hunched over and follows RW too far behind.   Stairs Stairs: Yes Stairs assistance: Supervision Stair Management: One rail Left;One rail Right;Alternating pattern;Step to pattern;Forwards Number of Stairs: 8 General stair comments: pt managed to haul the RW beside hime without incident, but was  mildly clumbsy doing it.  Wheelchair Mobility    Modified Rankin (Stroke Patients Only)       Balance Overall balance assessment: Needs assistance Sitting-balance support: No upper extremity supported Sitting balance-Leahy Scale: Fair Sitting balance - Comments: fall     Standing balance-Leahy Scale: Poor                      Cognition Arousal/Alertness: Awake/alert Behavior During Therapy: Flat affect Overall Cognitive Status: History of cognitive impairments - at baseline Area of Impairment: Attention;Following commands;Safety/judgement;Awareness;Problem solving   Current Attention Level: Sustained   Following Commands: Follows one step commands inconsistently Safety/Judgement: Decreased awareness of safety;Decreased awareness of deficits Awareness: Intellectual Problem Solving: Slow processing;Decreased initiation;Requires verbal cues;Requires tactile cues      Exercises      General Comments        Pertinent Vitals/Pain Pain Assessment: No/denies pain    Home Living                      Prior Function            PT Goals (current goals can now be found in the care plan section) Acute Rehab PT Goals Patient Stated Goal: get food PT Goal Formulation: With patient/family Time For Goal Achievement: 08/20/15 Potential to Achieve Goals: Fair Progress towards PT goals: Progressing toward goals;Goals met/education completed, patient discharged from PT    Frequency  Min 3X/week    PT Plan Current plan remains appropriate    Co-evaluation  End of Session   Activity Tolerance: Patient tolerated treatment well Patient left: with family/visitor present;with nursing/sitter in room;Other (comment) (sitting EOB)     Time: 3817-7116 PT Time Calculation (min) (ACUTE ONLY): 15 min  Charges:  $Gait Training: 8-22 mins                    G Codes:      Cammie Faulstich, Tessie Fass 08/09/2015, 12:50 PM 08/09/2015  Donnella Sham,  [place] [phone]  (pager)

## 2015-08-09 NOTE — Care Management Note (Addendum)
Case Management Note  Patient Details  Name: Henry Solomon MRN: [id number] Date of Birth: April 27, 1960  Subjective/Objective:                 Admitted with acute hepatic encephalopathy. [facility] will reside with mom @ d/c.  PCP: Chales AbrahamsMary Ann Plaucey  Action/Plan: Plan is to d/c today home with mom with home health services in place.  Expected Discharge Date:                  Expected Discharge Plan:  Home w Home Health Services  In-House Referral:     Discharge planning Services  CM Consult  Post Acute Care Choice:    Choice offered to:  Patient  DME Arranged:   rolling walker DME Agency:    [facility]/ [place] @ [phone] HH Arr[facility]:  [facility]  [facility]/ referral made with Hilda LiasMarie @ [phone]  Status of Service:     If discussed at Long Length of Stay Meetings, dates discussed:    Additional Comments: Pt d/c with XIFAXAN prescription. CM provided pt with zero copay coupon card for Xifaxan with instructions. Pt's mother verbally stated understanding of coupon usage.  Gae GallopCole, Mackenze Grandison [place], [place],CM [phone] 08/09/2015, 11:57 AM

## 2015-08-10 LAB — CULTURE, BLOOD (ROUTINE X 2)
Culture: NO GROWTH
Culture: NO GROWTH

## 2015-08-16 LAB — NGI HBV SUPERQUANT: Hepatitis B DNA: 1000000 Copies/mL

## 2015-08-23 ENCOUNTER — Ambulatory Visit (HOSPITAL_COMMUNITY)
Admission: EM | Admit: 2015-08-23 | Discharge: 2015-08-23 | Disposition: A | Discharge status: Home / Self Care | Payer: Medicaid Other | Attending: Family Medicine | Admitting: Family Medicine

## 2015-08-23 ENCOUNTER — Encounter (HOSPITAL_COMMUNITY): Payer: Self-pay | Admitting: Family Medicine

## 2015-08-23 DIAGNOSIS — M545 Low back pain: Secondary | ICD-10-CM

## 2015-08-23 DIAGNOSIS — Z760 Encounter for issue of repeat prescription: Secondary | ICD-10-CM

## 2015-08-23 MED ORDER — GLIPIZIDE 5 MG PO TABS: 5.0000 mg | ORAL_TABLET | Freq: Every day | ORAL | Status: DC

## 2015-08-23 MED ORDER — GABAPENTIN 300 MG PO CAPS: 300.0000 mg | ORAL_CAPSULE | Freq: Three times a day (TID) | ORAL | Status: DC

## 2015-08-23 MED ORDER — AMLODIPINE BESYLATE 5 MG PO TABS: 5.0000 mg | ORAL_TABLET | Freq: Every day | ORAL | Status: DC

## 2015-08-23 NOTE — Discharge Instructions (Signed)
Back Pain, Adult °Back pain is very common in adults. The cause of back pain is rarely dangerous and the pain often gets better over time. The cause of your back pain may not be known. Some common causes of back pain include: °· Strain of the muscles or ligaments supporting the spine. °· Wear and tear (degeneration) of the spinal disks. °· Arthritis. °· Direct injury to the back. °For many people, back pain may return. Since back pain is rarely dangerous, most people can learn to manage this condition on their own. °HOME CARE INSTRUCTIONS °Watch your back pain for any changes. The following actions may help to lessen any discomfort you are feeling: °· Remain active. It is stressful on your back to sit or stand in one place for long periods of time. Do not sit, drive, or stand in one place for more than 30 minutes at a time. Take short walks on even surfaces as soon as you are able. Try to increase the length of time you walk each day. °· Exercise regularly as directed by your health care provider. Exercise helps your back heal faster. It also helps avoid future injury by keeping your muscles strong and flexible. °· Do not stay in bed. Resting more than 1-2 days can delay your recovery. °· Pay attention to your body when you bend and lift. The most comfortable positions are those that put less stress on your recovering back. Always use proper lifting techniques, including: °¨ Bending your knees. °¨ Keeping the load close to your body. °¨ Avoiding twisting. °· Find a comfortable position to sleep. Use a firm mattress and lie on your side with your knees slightly bent. If you lie on your back, put a pillow under your knees. °· Avoid feeling anxious or stressed. Stress increases muscle tension and can worsen back pain. It is important to recognize when you are anxious or stressed and learn ways to manage it, such as with exercise. °· Take medicines only as directed by your health care provider. Over-the-counter  medicines to reduce pain and inflammation are often the most helpful. Your health care provider may prescribe muscle relaxant drugs. These medicines help dull your pain so you can more quickly return to your normal activities and healthy exercise. °· Apply ice to the injured area: °¨ Put ice in a plastic bag. °¨ Place a towel between your skin and the bag. °¨ Leave the ice on for 20 minutes, 2-3 times a day for the first 2-3 days. After that, ice and heat may be alternated to reduce pain and spasms. °· Maintain a healthy weight. Excess weight puts extra stress on your back and makes it difficult to maintain good posture. °SEEK MEDICAL CARE IF: °· You have pain that is not relieved with rest or medicine. °· You have increasing pain going down into the legs or buttocks. °· You have pain that does not improve in one week. °· You have night pain. °· You lose weight. °· You have a fever or chills. °SEEK IMMEDIATE MEDICAL CARE IF:  °· You develop new bowel or bladder control problems. °· You have unusual weakness or numbness in your arms or legs. °· You develop nausea or vomiting. °· You develop abdominal pain. °· You feel faint. °  °This information is not intended to replace advice given to you by your health care provider. Make sure you discuss any questions you have with your health care provider. °  °Document Released: 01/23/2005 Document Revised: 02/13/2014 Document Reviewed: 05/27/2013 °Elsevier Interactive Patient Education ©2016 Elsevier   Inc. Medicine Refill at the Emergency Department We have refilled your medicine today, but it is best for you to get refills through your primary health care provider's office. In the future, please plan ahead so you do not need to get refills from the emergency department. If the medicine we refilled was a maintenance medicine, you may have received only enough to get you by until you are able to see your regular health care provider.   This information is not intended to  replace advice given to you by your health care provider. Make sure you discuss any questions you have with your health care provider.   Document Released: 05/12/2003 Document Revised: 02/13/2014 Document Reviewed: 05/02/2013 Elsevier Interactive Patient Education [employer]2016 Elsevier Inc.

## 2015-08-23 NOTE — ED Provider Notes (Signed)
CSN: [id number]     Arrival date & time 08/23/15  1341 History   First MD Initiated Contact with Patient 08/23/15 1548     Chief Complaint  Patient presents with  . Medication Refill   (Consider location/radiation/quality/duration/timing/severity/associated sxs/prior Treatment) HPI History obtained from patient:  Pt presents with the cc of:  Out of medication, back pain Duration of symptoms: Back pain has been present for several days medication has been out for 2 days Treatment prior to arrival: Gabapentin Context: Patient has a very long complicated history of cirrhosis without ascites hepatitis B a recent bout of hepatic encephalopathy. He does not have a primary medical provider in this area yet he is discharged recently relocated from [place]. He states he is in need of most of his medications. He has been trying to get up primary physician in the area but has been without luck at this time. Other symptoms include: Back pain Pain score: 3 FAMILY HISTORY: Alcohol abuse    Past Medical History  Diagnosis Date  . Type II diabetes mellitus (HCC)   . Hepatitis B   . Chronic lower back pain   . Hypertension   . GERD (gastroesophageal reflux disease)   . Tuberculosis     "earlier this year; got it cleared up w/treatment" (08/05/2015)   Past Surgical History  Procedure Laterality Date  . Joint replacement    . Total hip arthroplasty Right 1978   History reviewed. No pertinent family history. Social History  Substance Use Topics  . Smoking status: Former Smoker -- 0.10 packs/day for 43 years    Types: Cigarettes    Quit date: 01/07/2015  . Smokeless tobacco: Never Used  . Alcohol Use: No    Review of Systems  Denies: HEADACHE, NAUSEA, ABDOMINAL PAIN, CHEST PAIN, CONGESTION, DYSURIA, SHORTNESS OF BREATH  Allergies  Review of patient's allergies indicates no known allergies.  Home Medications   Prior to Admission medications   Medication Sig Start Date End Date  Taking? Authorizing Provider  amLODipine (NORVASC) 5 MG tablet Take 5 mg by mouth daily.    Historical Provider, MD  amLODipine (NORVASC) 5 MG tablet Take 1 tablet (5 mg total) by mouth daily. 08/23/15   Tharon Aquas, PA  Calcium-Magnesium-Zinc (CAL-MAG-ZINC PO) Take 1 tablet by mouth daily.    Historical Provider, MD  cholecalciferol (VITAMIN D) 1000 units tablet Take 1,000 Units by mouth daily.    Historical Provider, MD  feeding supplement, GLUCERNA SHAKE, (GLUCERNA SHAKE) LIQD Take 237 mLs by mouth 3 (three) times daily between meals. 08/09/15   Jessica Ratliff Hoffman, DO  Flaxseed, Linseed, (FLAX SEEDS PO) Take 1 capsule by mouth daily.    Historical Provider, MD  folic acid (FOLVITE) 1 MG tablet Take 1 tablet (1 mg total) by mouth daily. 08/09/15   Camelia Phenes, DO  gabapentin (NEURONTIN) 100 MG capsule Take 100 mg by mouth at bedtime.    Historical Provider, MD  gabapentin (NEURONTIN) 300 MG capsule Take 1 capsule (300 mg total) by mouth 3 (three) times daily. 08/23/15   Tharon Aquas, PA  glipiZIDE (GLUCOTROL) 5 MG tablet Take 5 mg by mouth 2 (two) times daily before a meal.    Historical Provider, MD  glipiZIDE (GLUCOTROL) 5 MG tablet Take 1 tablet (5 mg total) by mouth daily before breakfast. 08/23/15   Tharon Aquas, PA  lactulose (CHRONULAC) 10 GM/15ML solution Take 45 mLs (30 g total) by mouth 3 (three) times daily. 08/09/15  Jessica Ratliff Hoffman, DO  pantoprazole (PROTONIX) 40 MG tablet Take 40 mg by mouth 2 (two) times daily.    Historical Provider, MD  rifaximin (XIFAXAN) 550 MG TABS tablet Take 1 tablet (550 mg total) by mouth 2 (two) times daily. 08/09/15   Camelia PhenesJessica Ratliff Hoffman, DO  thiamine 100 MG tablet Take 1 tablet (100 mg total) by mouth daily. 08/09/15   Camelia PhenesJessica Ratliff Hoffman, DO  vitamin B-12 (CYANOCOBALAMIN) 1000 MCG tablet Take 1,000 mcg by mouth daily.    Historical Provider, MD   Meds Ordered and Administered this Visit  Medications - No data to  display  BP 139/75 mmHg  Pulse 74  Temp(Src) 98.6 F (37 C) (Oral)  Resp 12  SpO2 100% No data found.   Physical Exam NURSES NOTES AND VITAL SIGNS REVIEWED. CONSTITUTIONAL: Well developed, well nourished, no acute distress HEENT: normocephalic, atraumatic EYES: Conjunctiva normal NECK:normal ROM, supple, no adenopathy PULMONARY:No respiratory distress, normal effort ABDOMINAL: Soft, ND, NT BS+, No CVAT MUSCULOSKELETAL: Normal ROM of all extremities,  SKIN: warm and dry without rash PSYCHIATRIC: Mood and affect, behavior are normal   ED Course  Procedures (including critical care time)  Labs Review Labs Reviewed - No data to display  Imaging Review No results found.   Visual Acuity Review  Right Eye Distance:   Left Eye Distance:   Bilateral Distance:    Right Eye Near:   Left Eye Near:    Bilateral Near:      Prescriptions for his amlodipine glipizide and gabapentin are been renewed. He and the family are advised to call the sickle cell center tomorrow to arrange for primary care and follow-up. No new medications have been given.  Total Visit Time:MINUTES "GREATER THAN 30  50% WAS SPENT IN COUNSELING AND COORDINATION OF CARE WITH THE PATIENT"  in trying to arrange for patient to get his medications and primary care follow-up.  MDM   1. Low back pain without sciatica, unspecified back pain laterality   2. Medication refill     Patient is reassured that there are no issues that require transfer to higher level of care at this time or additional tests. Patient is advised to continue home symptomatic treatment. Patient is advised that if there are new or worsening symptoms to attend the emergency department, contact primary care provider, or return to UC. Instructions of care provided discharged home in stable condition.    THIS NOTE WAS GENERATED USING A VOICE RECOGNITION SOFTWARE PROGRAM. ALL REASONABLE EFFORTS  WERE MADE TO PROOFREAD THIS DOCUMENT FOR  ACCURACY.  I have verbally reviewed the discharge instructions with the patient. A printed AVS was given to the patient.  All questions were answered prior to discharge.      Tharon AquasFrank C Ricard Faulkner, PA 08/23/15 2037

## 2015-08-23 NOTE — ED Notes (Signed)
Pt here for lower back pain and knee pain. sts he needs pain meds. Per family pt is needing all meds refilled. Unable to get an appt with PCP. One specific med being lactulose.

## 2015-08-26 ENCOUNTER — Observation Stay (HOSPITAL_COMMUNITY)
Admission: EM | Admit: 2015-08-26 | Discharge: 2015-08-27 | Disposition: A | Discharge status: Home / Self Care | Payer: Medicaid Other | Attending: Internal Medicine | Admitting: Internal Medicine

## 2015-08-26 ENCOUNTER — Encounter (HOSPITAL_COMMUNITY): Payer: Self-pay | Admitting: Emergency Medicine

## 2015-08-26 DIAGNOSIS — K219 Gastro-esophageal reflux disease without esophagitis: Secondary | ICD-10-CM | POA: Insufficient documentation

## 2015-08-26 DIAGNOSIS — Z96641 Presence of right artificial hip joint: Secondary | ICD-10-CM | POA: Diagnosis not present

## 2015-08-26 DIAGNOSIS — Z681 Body mass index (BMI) 19 or less, adult: Secondary | ICD-10-CM | POA: Diagnosis not present

## 2015-08-26 DIAGNOSIS — K7460 Unspecified cirrhosis of liver: Secondary | ICD-10-CM | POA: Diagnosis not present

## 2015-08-26 DIAGNOSIS — R7989 Other specified abnormal findings of blood chemistry: Secondary | ICD-10-CM

## 2015-08-26 DIAGNOSIS — E440 Moderate protein-calorie malnutrition: Secondary | ICD-10-CM | POA: Diagnosis present

## 2015-08-26 DIAGNOSIS — K7682 Hepatic encephalopathy: Secondary | ICD-10-CM | POA: Diagnosis present

## 2015-08-26 DIAGNOSIS — I10 Essential (primary) hypertension: Secondary | ICD-10-CM | POA: Diagnosis not present

## 2015-08-26 DIAGNOSIS — B181 Chronic viral hepatitis B without delta-agent: Secondary | ICD-10-CM | POA: Diagnosis present

## 2015-08-26 DIAGNOSIS — E119 Type 2 diabetes mellitus without complications: Secondary | ICD-10-CM | POA: Diagnosis not present

## 2015-08-26 DIAGNOSIS — Z87891 Personal history of nicotine dependence: Secondary | ICD-10-CM | POA: Diagnosis not present

## 2015-08-26 DIAGNOSIS — G9340 Encephalopathy, unspecified: Secondary | ICD-10-CM | POA: Diagnosis present

## 2015-08-26 DIAGNOSIS — K7290 Hepatic failure, unspecified without coma: Secondary | ICD-10-CM | POA: Diagnosis not present

## 2015-08-26 LAB — COMPREHENSIVE METABOLIC PANEL
ALK PHOS: 166 U/L — AB (ref 38–126)
ALT: 158 U/L — AB (ref 17–63)
ANION GAP: 6 (ref 5–15)
AST: 185 U/L — ABNORMAL HIGH (ref 15–41)
Albumin: 2.5 g/dL — ABNORMAL LOW (ref 3.5–5.0)
BILIRUBIN TOTAL: 0.9 mg/dL (ref 0.3–1.2)
BUN: 17 mg/dL (ref 6–20)
CO2: 21 mmol/L — ABNORMAL LOW (ref 22–32)
Calcium: 9.2 mg/dL (ref 8.9–10.3)
Chloride: 112 mmol/L — ABNORMAL HIGH (ref 101–111)
Creatinine, Ser: 1.33 mg/dL — ABNORMAL HIGH (ref 0.61–1.24)
GFR calc Af Amer: >60 mL/min (ref >60)
GFR calc non Af Amer: 59 mL/min — ABNORMAL LOW (ref >60)
GLUCOSE: 117 mg/dL — AB (ref 65–99)
POTASSIUM: 3.9 mmol/L (ref 3.5–5.1)
Sodium: 139 mmol/L (ref 135–145)
TOTAL PROTEIN: 7.2 g/dL (ref 6.5–8.1)

## 2015-08-26 LAB — URINALYSIS, ROUTINE W REFLEX MICROSCOPIC
Bilirubin Urine: NEGATIVE
Glucose, UA: NEGATIVE mg/dL
KETONES UR: NEGATIVE mg/dL
LEUKOCYTES UA: NEGATIVE
NITRITE: NEGATIVE
PROTEIN: 100 mg/dL — AB
Specific Gravity, Urine: 1.009 (ref 1.005–1.030)
pH: 7 (ref 5.0–8.0)

## 2015-08-26 LAB — CBC
HEMATOCRIT: 37.7 % — AB (ref 39.0–52.0)
HEMOGLOBIN: 12.6 g/dL — AB (ref 13.0–17.0)
MCH: 30.7 pg (ref 26.0–34.0)
MCHC: 33.4 g/dL (ref 30.0–36.0)
MCV: 92 fL (ref 78.0–100.0)
Platelets: UNDETERMINED 10*3/uL (ref 150–400)
RBC: 4.1 MIL/uL — AB (ref 4.22–5.81)
RDW: 14.4 % (ref 11.5–15.5)
WBC: 6.1 10*3/uL (ref 4.0–10.5)

## 2015-08-26 LAB — CBG MONITORING, ED: Glucose-Capillary: 112 mg/dL — ABNORMAL HIGH (ref 65–99)

## 2015-08-26 LAB — URINE MICROSCOPIC-ADD ON

## 2015-08-26 LAB — GLUCOSE, CAPILLARY: GLUCOSE-CAPILLARY: 245 mg/dL — AB (ref 65–99)

## 2015-08-26 LAB — AMMONIA: AMMONIA: 100 umol/L — AB (ref 9–35)

## 2015-08-26 MED ORDER — PANTOPRAZOLE SODIUM 40 MG PO TBEC
40.0000 mg | DELAYED_RELEASE_TABLET | Freq: Every day | ORAL | Status: DC
  Administered 2015-08-26 – 2015-08-27 (×2): 40 mg via ORAL
  Filled 2015-08-26 (×2): qty 1

## 2015-08-26 MED ORDER — VITAMIN B-1 100 MG PO TABS
100.0000 mg | ORAL_TABLET | Freq: Every day | ORAL | Status: DC
  Administered 2015-08-26 – 2015-08-27 (×2): 100 mg via ORAL
  Filled 2015-08-26 (×2): qty 1

## 2015-08-26 MED ORDER — AMLODIPINE BESYLATE 5 MG PO TABS
5.0000 mg | ORAL_TABLET | Freq: Every day | ORAL | Status: DC
  Administered 2015-08-26 – 2015-08-27 (×2): 5 mg via ORAL
  Filled 2015-08-26 (×2): qty 1

## 2015-08-26 MED ORDER — GLIPIZIDE 5 MG PO TABS
5.0000 mg | ORAL_TABLET | Freq: Two times a day (BID) | ORAL | Status: DC
  Administered 2015-08-27 (×2): 5 mg via ORAL
  Filled 2015-08-26 (×2): qty 1

## 2015-08-26 MED ORDER — INSULIN ASPART 100 UNIT/ML ~~LOC~~ SOLN
0.0000 [IU] | Freq: Every day | SUBCUTANEOUS | Status: DC
  Administered 2015-08-26: 2 [IU] via SUBCUTANEOUS

## 2015-08-26 MED ORDER — RIFAXIMIN 550 MG PO TABS
550.0000 mg | ORAL_TABLET | Freq: Two times a day (BID) | ORAL | Status: DC
  Administered 2015-08-26 – 2015-08-27 (×2): 550 mg via ORAL
  Filled 2015-08-26 (×2): qty 1

## 2015-08-26 MED ORDER — LACTULOSE 10 GM/15ML PO SOLN
30.0000 g | Freq: Three times a day (TID) | ORAL | Status: DC
  Administered 2015-08-26 – 2015-08-27 (×3): 30 g via ORAL
  Filled 2015-08-26 (×3): qty 45

## 2015-08-26 MED ORDER — FOLIC ACID 1 MG PO TABS
1.0000 mg | ORAL_TABLET | Freq: Every day | ORAL | Status: DC
  Administered 2015-08-26 – 2015-08-27 (×2): 1 mg via ORAL
  Filled 2015-08-26 (×2): qty 1

## 2015-08-26 MED ORDER — INSULIN ASPART 100 UNIT/ML ~~LOC~~ SOLN
0.0000 [IU] | Freq: Three times a day (TID) | SUBCUTANEOUS | Status: DC
  Administered 2015-08-27: 11 [IU] via SUBCUTANEOUS
  Administered 2015-08-27: 5 [IU] via SUBCUTANEOUS

## 2015-08-26 NOTE — H&P (Signed)
Date: 08/26/2015               Patient Name:  Henry Solomon MRN: [id number]  DOB: 1960/03/18 Age / Sex: 55 y.o., male   PCP: Marliss Coots, NP         Medical Service: Internal Medicine Teaching Service         Attending Physician: Dr. Oval Linsey, MD    First Contact: Dr. Heber Hornbeak Pager: [phone]  Second Contact: Dr. Arcelia Jew Pager: [phone]       After Hours (After 5p/  First Contact Pager: [phone]  weekends / holidays): Second Contact Pager: [phone]   Chief Complaint: confusion  History of Present Illness: Henry Solomon is a 55 yo male with PMHx of chronic hepatitis B (HBV DNA >1000000 in July 2017), Cirrhosis, hepatic encephalopathy, and T2DM who presents to the ED with confusion. Patient is currently living with his mother in [place], but is recently from [place].  Patient was recently admitted in June-July 2017 for hepatic encephalopathy 2/2 underlying cirrhosis. At that time, lactulose was uptitrated to 30 grams TID for 3-4 bowel movements per day and rifaximin 550 mg BID was added. Patient was discharged after improvement and mental status and resolution of asterixis. He was to follow up with his PCP in [place] after discharge.   Patient presents to the ED today with his mother, stating patient ran out of his lactulose 2 days ago and has since been more confused. He never received rifaximin on discharge to the cost of the medication. Patient's mother states this morning he was just sitting on the side of the bed with his head down. He is normally up, moving and watching the news prior to when she wakes up. Because of this, she knew something was different. Patient states he feels "woozy." Patient and mother report patient peed on the side of the bed and on the furniture which is unusual for him. Normally, patient can perform all of his ADLs, but he can't drive. He walks with a walker or with a cane. Patient is recently from [place] and will be staying in [place] with  his mother. He needs a PCP.   In the ED, patient was afebrile at 98.6, mildly hypertensive at 154/79, HR 64, RR 20, satting 99% on room air. CBC was within normal limits- WBC 6.1, Hgb 12.6. CMET revealed creatinine of 1.33 (baseline 1.3-1.4), alk phos 166, AST 185, ALT 158 - all at baseline. UA without signs of infection. EKG shows TWI in V3-V4, V5 with QTc prolongation. TW inversions are old in V2, V3.   Meds: No current facility-administered medications for this encounter.   Current Outpatient Prescriptions  Medication Sig Dispense Refill  . amLODipine (NORVASC) 5 MG tablet Take 1 tablet (5 mg total) by mouth daily. 90 tablet 2  . Calcium-Magnesium-Zinc (CAL-MAG-ZINC PO) Take 1 tablet by mouth daily.    . cholecalciferol (VITAMIN D) 1000 units tablet Take 1,000 Units by mouth daily.    . feeding supplement, GLUCERNA SHAKE, (GLUCERNA SHAKE) LIQD Take 237 mLs by mouth 3 (three) times daily between meals. 30 Can 0  . Flaxseed, Linseed, (FLAX SEEDS PO) Take 1 capsule by mouth daily.    . folic acid (FOLVITE) 1 MG tablet Take 1 tablet (1 mg total) by mouth daily. 30 tablet 1  . gabapentin (NEURONTIN) 300 MG capsule Take 1 capsule (300 mg total) by mouth 3 (three) times daily. 90 capsule 2  . glipiZIDE (GLUCOTROL) 5 MG  tablet Take 1 tablet (5 mg total) by mouth daily before breakfast. 90 tablet 2  . lactulose (CHRONULAC) 10 GM/15ML solution Take 45 mLs (30 g total) by mouth 3 (three) times daily. 240 mL 2  . pantoprazole (PROTONIX) 40 MG tablet Take 40 mg by mouth 2 (two) times daily.    . rifaximin (XIFAXAN) 550 MG TABS tablet Take 1 tablet (550 mg total) by mouth 2 (two) times daily. 60 tablet 2  . thiamine 100 MG tablet Take 1 tablet (100 mg total) by mouth daily. 30 tablet 1  . vitamin B-12 (CYANOCOBALAMIN) 1000 MCG tablet Take 1,000 mcg by mouth daily.      Allergies: Allergies as of 08/26/2015  . (No Known Allergies)   Past Medical History  Diagnosis Date  . Type II diabetes mellitus  ([place])   . Hepatitis B   . Chronic lower back pain   . Hypertension   . GERD (gastroesophageal reflux disease)   . Tuberculosis     "earlier this year; got it cleared up w/treatment" (08/05/2015)   Family History:  M: CABG, CHF  Social History:  Tobacco Use: Denies Alcohol Use: Previous heavy drinker, 3-4 years ago quit Illicit Drug Use: Denies  Review of Systems: A complete ROS was negative except as per HPI.   Physical Exam: Filed Vitals:   08/26/15 1433 08/26/15 1500 08/26/15 1531 08/26/15 1600  BP: 150/93 144/83  157/89  Pulse: 64 58  67  Temp:      Resp: _0 Height:      Weight:      SpO2: 100% 100% 100% 100%   General: Vital signs reviewed. Patient is well-developed and well-nourished, resting in bed in no acute distress. He is disheveled and malodorous. Head: Normocephalic and atraumatic, facial asymmetry apparent with R NLF flattening, poor dentition  Eyes: EOMI, conjunctivae normal, no scleral icterus.  Neck: Supple, trachea midline, normal ROM, no JVD, no carotid bruit present.  Cardiovascular: RRR, S1 normal, S2 normal, no murmurs, gallops, or rubs. Pulmonary/Chest: Clear to auscultation bilaterally, no wheezes, rales, or rhonchi. Abdominal: Soft, non-tender, mildly-distended but no discernible fluid wave, BS + Musculoskeletal: No joint deformities, erythema, or stiffness, full ROM Extremities: No lower extremity edema bilaterally,  pulses symmetric and intact bilaterally. No cyanosis or clubbing. Neurological: A&Ox2, difficulty following some commands, asterixis present in BUEs, cranial nerves II-XII intact, strength 5/5 throughout, sensation grossly intact, some extinction to light tough in LLE,  coordination (FNF and heel to knee) impaired bilaterally, DTRs 2+ throughout, no clonus present,    Skin: Warm, dry and intact. No rashes or erythema. Psychiatric: Blunted affect. Quiet speech. Cognition and memory grossly intact to recent events.   EKG: Prolonged  QT, sinus rhythm, TWI in V3-V5. Old TWI apparent in V2-V3.   Assessment & Plan by Problem: Principal Problem:   Hepatic encephalopathy ([place]) Active Problems:   Diabetes mellitus without complication (HCC)   Malnutrition of moderate degree   Chronic hepatitis B (HCC)   Cirrhosis of liver without ascites [facility])  Mr. Schrecengost is a 55 yo male with PMHx of chronic hepatitis B (HBV DNA >1000000 in July 2017), Cirrhosis, hepatic encephalopathy, and T2DM who presents to the ED with confusion.   Mild Hepatic Encephalopathy: Secondary to running out of his medications. Patient is supposed to be on rifaximin 550 mg BID and Lactulose 30 grams TID titrated to 3-4 bowel movements per day. Patient never got the rifaximin due to the cost. He ran out of  lactulose 2 days ago.Patient is having 3-4 bowel movements per day. There is asterixis on exam.  -Resume lactulose 30 grams TID and titrate to 3-4 bowel movements per day -Resume rifaximin 550 mg BID -Patient will need to establish care with our clinic as he is staying in [place]  Chronic Hepatitis B Virus: On previous admission, HBV DNA >1000000 in July 2017. Patient was to follow up with PCP in [place], but mother states he will stay with her now in [place].  -Follow up with [facility] -Will need referral to Infectious Disease   T2DM: Patient is on glipizide 5 mg BID at home. Patient's A1c in July 2017 was 11.7.  -Continue glipizide 5 mg BID -Consider increase to 10 mg BID -SSI-M -CBG ACHS -Carb Mod diet  DVT/PE ppx: Platelets have been teetering around 100, will hold ppx and place SCDs CODE: FULL FEN: Carb Mod  Dispo: Admit patient to Observation with expected length of stay less than 2 midnights.  Signed: Asencion Partridge, MD [phone]

## 2015-08-26 NOTE — ED Notes (Signed)
Onset today having a bowel movement while straining became dizzy resolved after BM. Per EMS. Family member stated have not taking lactulose for 1-2 days ran out due to money and his ammonia levels might be off.  Patient normal alert slow to respond confused increased in symptoms today.

## 2015-08-26 NOTE — ED Notes (Signed)
IV start being attempted

## 2015-08-26 NOTE — ED Notes (Signed)
Spoke with EDP regarding ammonia level.

## 2015-08-26 NOTE — ED Provider Notes (Signed)
CSN: [id number]     Arrival date & time 08/26/15  1200 History   First MD Initiated Contact with Patient 08/26/15 1219     Chief Complaint  Patient presents with  . Abnormal Lab  . Altered Mental Status   Level V caveat: Altered mental status.   Henry Solomon is a 55 y.o. male with a history Of cirrhosis, hepatitis B, diabetes, and chronic low back pain who presents to the emergency department with altered mental status. Patient brought in by EMS. Per EMS family member stated that the patient has not been taking his lactulose over the past 1-2 days because he is run out. The patient has been more confused compared to baseline. The patient is a poor historian and provides little information regarding to his visit today. He does complain of his low back pain.   Patient is a 55 y.o. male presenting with altered mental status. The history is provided by the patient, the EMS personnel and medical records. No language interpreter was used.  Altered Mental Status   Past Medical History  Diagnosis Date  . Type II diabetes mellitus (HCC)   . Hepatitis B   . Chronic lower back pain   . Hypertension   . GERD (gastroesophageal reflux disease)   . Tuberculosis     "earlier this year; got it cleared up w/treatment" (08/05/2015)   Past Surgical History  Procedure Laterality Date  . Joint replacement    . Total hip arthroplasty Right 1978   No family history on file. Social History  Substance Use Topics  . Smoking status: Former Smoker -- 0.10 packs/day for 43 years    Types: Cigarettes    Quit date: 01/07/2015  . Smokeless tobacco: Never Used  . Alcohol Use: No    Review of Systems  Unable to perform ROS: Mental status change      Allergies  Review of patient's allergies indicates no known allergies.  Home Medications   Prior to Admission medications   Medication Sig Start Date End Date Taking? Authorizing Provider  amLODipine (NORVASC) 5 MG tablet Take 1 tablet (5 mg total) by  mouth daily. 08/23/15  Yes Tharon Aquas, PA  Calcium-Magnesium-Zinc (CAL-MAG-ZINC PO) Take 1 tablet by mouth daily.   Yes Historical Provider, MD  cholecalciferol (VITAMIN D) 1000 units tablet Take 1,000 Units by mouth daily.   Yes Historical Provider, MD  feeding supplement, GLUCERNA SHAKE, (GLUCERNA SHAKE) LIQD Take 237 mLs by mouth 3 (three) times daily between meals. 08/09/15  Yes Jessica Ratliff Hoffman, DO  Flaxseed, Linseed, (FLAX SEEDS PO) Take 1 capsule by mouth daily.   Yes Historical Provider, MD  folic acid (FOLVITE) 1 MG tablet Take 1 tablet (1 mg total) by mouth daily. 08/09/15  Yes Jessica Ratliff Hoffman, DO  gabapentin (NEURONTIN) 300 MG capsule Take 1 capsule (300 mg total) by mouth 3 (three) times daily. 08/23/15  Yes Tharon Aquas, PA  glipiZIDE (GLUCOTROL) 5 MG tablet Take 1 tablet (5 mg total) by mouth daily before breakfast. 08/23/15  Yes Tharon Aquas, PA  lactulose (CHRONULAC) 10 GM/15ML solution Take 45 mLs (30 g total) by mouth 3 (three) times daily. 08/09/15  Yes Jessica Ratliff Hoffman, DO  pantoprazole (PROTONIX) 40 MG tablet Take 40 mg by mouth 2 (two) times daily.   Yes Historical Provider, MD  rifaximin (XIFAXAN) 550 MG TABS tablet Take 1 tablet (550 mg total) by mouth 2 (two) times daily. 08/09/15  Yes Camelia Phenes, DO  thiamine 100 MG tablet Take 1 tablet (100 mg total) by mouth daily. 08/09/15  Yes Jessica Ratliff Hoffman, DO  vitamin B-12 (CYANOCOBALAMIN) 1000 MCG tablet Take 1,000 mcg by mouth daily.   Yes Historical Provider, MD   BP 150/93 mmHg  Pulse 64  Temp(Src) 97.8 F (36.6 C)  Resp 18  Ht 5\' 10"  (1.778 m)  Wt 61.236 kg  BMI 19.37 kg/m2  SpO2 100% Physical Exam  Constitutional: He appears well-developed and well-nourished. No distress.  Nontoxic appearing.  HENT:  Head: Normocephalic and atraumatic.  Right Ear: External ear normal.  Left Ear: External ear normal.  Mouth/Throat: Oropharynx is clear and moist.  No visible signs of head  trauma.  Eyes: Conjunctivae are normal. Pupils are equal, round, and reactive to light. Right eye exhibits no discharge. Left eye exhibits no discharge.  Neck: Normal range of motion. Neck supple. No JVD present.  Cardiovascular: Normal rate, regular rhythm, normal heart sounds and intact distal pulses.  Exam reveals no gallop and no friction rub.   Bilateral radial pulses are intact.  Pulmonary/Chest: Effort normal and breath sounds normal. No stridor. No respiratory distress. He has no wheezes. He has no rales.  Lungs are clear to auscultation bilaterally.  Abdominal: Soft. Bowel sounds are normal. He exhibits no distension. There is no tenderness. There is no guarding.  Abdomen is soft and nontender to palpation.  Musculoskeletal: He exhibits no edema or tenderness.  No lower extremity edema or tenderness.  Lymphadenopathy:    He has no cervical adenopathy.  Neurological: He is alert. Coordination normal.  Patient is alert and oriented to person and place only. He is spontaneously moving his upper and lower extremities without evidence of focal weakness. Speech is clear and coherent. Patient is slow to respond to most questions.  Skin: Skin is warm and dry. No rash noted. He is not diaphoretic. No erythema. No pallor.  Psychiatric: He has a normal mood and affect. His behavior is normal.  Nursing note and vitals reviewed.   ED Course  Procedures (including critical care time) Labs Review Labs Reviewed  COMPREHENSIVE METABOLIC PANEL - Abnormal; Notable for the following:    Chloride 112 (*)    CO2 21 (*)    Glucose, Bld 117 (*)    Creatinine, Ser 1.33 (*)    Albumin 2.5 (*)    AST 185 (*)    ALT 158 (*)    Alkaline Phosphatase 166 (*)    GFR calc non Af Amer 59 (*)    All other components within normal limits  CBC - Abnormal; Notable for the following:    RBC 4.10 (*)    Hemoglobin 12.6 (*)    HCT 37.7 (*)    All other components within normal limits  AMMONIA - Abnormal;  Notable for the following:    Ammonia 100 (*)    All other components within normal limits  URINALYSIS, ROUTINE W REFLEX MICROSCOPIC (NOT AT [facility]) - Abnormal; Notable for the following:    Hgb urine dipstick TRACE (*)    Protein, ur 100 (*)    All other components within normal limits  URINE MICROSCOPIC-ADD ON - Abnormal; Notable for the following:    Squamous Epithelial / LPF 0-5 (*)    Bacteria, UA FEW (*)    Casts GRANULAR CAST (*)    All other components within normal limits  CBG MONITORING, ED - Abnormal; Notable for the following:    Glucose-Capillary 112 (*)    All  other components within normal limits    Imaging Review No results found. I have personally reviewed and evaluated these images and lab results as part of my medical decision-making.   EKG Interpretation   Date/Time:  Thursday August 26 2015 12:36:49 EDT Ventricular Rate:  69 PR Interval:    QRS Duration: 91 QT Interval:  533 QTC Calculation: 572 R Axis:   -2 Text Interpretation:  Sinus rhythm Ventricular premature complex Left  atrial enlargement Abnrm T, consider ischemia, anterolateral lds Prolonged  QT interval Baseline wander in lead(s) II III aVF No STEMI.  Confirmed by  LONG MD, JOSHUA [phone]) on 08/26/2015 12:44:16 PM      Filed Vitals:   08/26/15 1211 08/26/15 1232 08/26/15 1433 08/26/15 1531  BP:  147/87 150/93   Pulse:  67 64   Temp:  97.8 F (36.6 C)    Resp:  16 18   Height:  (1.778 m)     Weight: 61.236 kg     SpO2:  100% 100% 100%     MDM   Meds given in ED:  Medications - No data to display  New Prescriptions   No medications on file    Final diagnoses:  Hepatic encephalopathy (HCC)  Increased ammonia level   This is a 55 y.o. male with a history Of cirrhosis, hepatitis B, diabetes, and chronic low back pain who presents to the emergency department with altered mental status. Patient brought in by EMS. Per EMS family member stated that the patient has not been taking  his lactulose over the past 1-2 days because he is run out. The patient has been more confused compared to baseline. On exam the patient is afebrile nontoxic appearing. He has prolonged QTC on EKG. He is alert and oriented to person and place only. He is confused about why he is here today. CMP reveals a creatinine around his baseline. Elevated AST and ALT in the 100s. Ammonia is elevated at 100. He was admitted 3 weeks ago for hepatic encephalopathy with an ammonia of 125. Urinalysis is unremarkable. CBC shows a hemoglobin of 12.6. No leukocytosis. Will admit for hepatic encephalopathy with an ammonia of 100. I consulted with Dr. Lawerance Bach from internal medicine teaching service who accepted the patient for admission. She requested MedSurg temporary admission orders.  This patient was discussed with Dr. Jacqulyn Bath who agrees with assessment and plan.    Everlene Farrier, PA-C 08/26/15 1544  Maia Plan, MD 08/26/15 1550

## 2015-08-26 NOTE — ED Notes (Signed)
Checked CBG 112, RN Juli informed

## 2015-08-27 ENCOUNTER — Telehealth: Payer: Self-pay | Admitting: Internal Medicine

## 2015-08-27 DIAGNOSIS — B181 Chronic viral hepatitis B without delta-agent: Secondary | ICD-10-CM

## 2015-08-27 DIAGNOSIS — K7290 Hepatic failure, unspecified without coma: Secondary | ICD-10-CM

## 2015-08-27 DIAGNOSIS — E440 Moderate protein-calorie malnutrition: Secondary | ICD-10-CM

## 2015-08-27 DIAGNOSIS — K7460 Unspecified cirrhosis of liver: Secondary | ICD-10-CM

## 2015-08-27 DIAGNOSIS — E119 Type 2 diabetes mellitus without complications: Secondary | ICD-10-CM

## 2015-08-27 LAB — GLUCOSE, CAPILLARY
Glucose-Capillary: 205 mg/dL — ABNORMAL HIGH (ref 65–99)
Glucose-Capillary: 324 mg/dL — ABNORMAL HIGH (ref 65–99)
Glucose-Capillary: 135 mg/dL — ABNORMAL HIGH (ref 65–99)

## 2015-08-27 MED ORDER — AMLODIPINE BESYLATE 5 MG PO TABS: 5.0000 mg | ORAL_TABLET | Freq: Every day | ORAL | Status: DC

## 2015-08-27 MED ORDER — LACTULOSE 10 GM/15ML PO SOLN: 30.0000 g | Freq: Three times a day (TID) | ORAL | Status: DC

## 2015-08-27 MED ORDER — GLIPIZIDE 5 MG PO TABS: 5.0000 mg | ORAL_TABLET | Freq: Two times a day (BID) | ORAL | Status: DC

## 2015-08-27 MED FILL — glipiZIDE 5 MG TABS: 5 | 30 days supply | Qty: 60 | Fill #0

## 2015-08-27 MED FILL — LACTULOSE 10 GM/15 ML SOLN: 10 | 2 days supply | Qty: 240 | Fill #0

## 2015-08-27 MED FILL — ?AMLODIPINE BESYLATE 5 MG T: 5 | 30 days supply | Qty: 30 | Fill #0

## 2015-08-27 NOTE — Telephone Encounter (Signed)
HFU/TRAN/FU.  Pt has been sch for 09/10/2015 for his HFU

## 2015-08-27 NOTE — Progress Notes (Signed)
CM received consult: difficulty affording lactulose and xifaxan. CM spoke with pt/mom regarding medication issue. Mom states pt nor she can afford medication cost for lactose and xifaxan. Plan is for pt to d/c with prescription for lactulose. [facility] and assistance with medication cost. Information provided to pt/mom , [facility]. Gae Gallopngela Tiegan Jambor RN,BSN,CM [phone]

## 2015-08-27 NOTE — Progress Notes (Signed)
NURSING PROGRESS NOTE  Henry NovemberScottie L Solomon [id number] Discharge Data: 08/27/2015 4:05 PM Attending Provider: Doneen PoissonLawrence Klima, MD WUJ:WJXBJY,NWGNPCP:PLACEY,MARY H, NP   Henry Solomon to be D/C'd Home per MD order.    All IV's will be discontinued and monitored for bleeding.  All belongings will be returned to patient for patient to take home.  Last Documented Vital Signs:  Blood pressure 155/73, pulse 86, temperature 99.1 F (37.3 C), temperature source Oral, resp. rate 18, height 5\' 9"  (1.753 m), weight 61.236 kg (135 lb), SpO2 100 %.  Henry RearLonnie Mariaeduarda Defranco, MSN, RN, [employer]

## 2015-08-27 NOTE — Progress Notes (Signed)
   Subjective: Mr.Northup was evaluated this morning on rounds.  Patient was able to tell me his current location, the year and the president.  He denied and nausea and vomiting. He was currently looking forward to eating breakfast.  He had 3 bowel movements yesterday.  He denied SOB, chest pain, or leg swelling.     Objective: Vital signs in last 24 hours: Filed Vitals:   08/26/15 2016 08/26/15 2144 08/27/15 0622 08/27/15 1309  BP: 142/75 151/87 126/76 155/73  Pulse:  78 66 86  Temp:  98.4 F (36.9 C) 98.5 F (36.9 C) 99.1 F (37.3 C)  TempSrc:  Oral Oral   Resp:  19 20 18   Height:      Weight:      SpO2:  100% 100% 100%   Physical Exam Physical Exam  Constitutional: He is oriented to person, place, and time.  HENT:  Head: Normocephalic and atraumatic.  Cardiovascular: Normal rate and regular rhythm.   Pulmonary/Chest: Effort normal and breath sounds normal. No respiratory distress.  Abdominal: Soft. Bowel sounds are normal.  Mild tenderness to all 4 quadrants  Musculoskeletal: He exhibits no edema.  Scoliosis  No asterixis noted   Neurological: He is alert and oriented to person, place, and time.  Skin: Skin is warm and dry.  Psychiatric: Mood and affect normal.    Assessment/Plan: Hepatic encephalopathy (HCC) Patient appears at baseline and is eating and conversing well.  Spoke with mother in the afternoon and she stated she could not afford the$15 lactulose bottle.  Case manager was present and the prescription of lactulose was faxed to [facility] in hopes the patient can receive the lactulose prescription for free or discounted price.  Patient was given 11 refills for the year in hopes this will help patient not run out unexpectedly.  Patient [place] in [place] at this time.  Patient was given [facility] app[facility], if [facility] to [facility] he may stay with them. - Lactulose  30 grams TID and titrate to 3-4 bowel movements per day - Rifaximin 550 mg BID   Diabetes mellitus without complication (HCC) Home meds were prescribed.  Will need outpatient management with established PCP. - glipizide 5 mg BID  Chronic Hepatitis B virus - Will need referral with Infectious Disease once established care with PCP.   Dispo: Anticipated discharge today     Camelia PhenesJessica Ratliff Kimberlly Norgard, DO 08/27/2015, 6:55 PM Pager: [phone]

## 2015-08-27 NOTE — Discharge Summary (Signed)
Name: Henry Solomon MRN: [id number] DOB: 1960/05/30 55 y.o. PCP: Lavinia SharpsMary Ann Placey, NP  Date of Admission: 08/26/2015 12:00 PM Date of Discharge: 08/27/2015 Attending Physician: Doneen PoissonLawrence Klima, MD  Discharge Diagnosis: 1. Hepatic encephalopathy (HCC)  Active Problems:   Diabetes mellitus without complication (HCC)   Malnutrition of moderate degree   Chronic hepatitis B (HCC)   Cirrhosis of liver without ascites (HCC)   Discharge Medications:   Medication List    TAKE these medications   amLODipine 5 MG tablet Commonly known as:  NORVASC Take 1 tablet (5 mg total) by mouth daily.   CAL-MAG-ZINC PO Take 1 tablet by mouth daily.   cholecalciferol 1000 units tablet Commonly known as:  VITAMIN D Take 1,000 Units by mouth daily.   feeding supplement (GLUCERNA SHAKE) Liqd Take 237 mLs by mouth 3 (three) times daily between meals.   FLAX SEEDS PO Take 1 capsule by mouth daily.   folic acid 1 MG tablet Commonly known as:  FOLVITE Take 1 tablet (1 mg total) by mouth daily.   gabapentin 300 MG capsule Commonly known as:  NEURONTIN Take 1 capsule (300 mg total) by mouth 3 (three) times daily.   glipiZIDE 5 MG tablet Commonly known as:  GLUCOTROL Take 1 tablet (5 mg total) by mouth daily before breakfast. What changed:  Another medication with the same name was added. Make sure you understand how and when to take each.   glipiZIDE 5 MG tablet Commonly known as:  GLUCOTROL Take 1 tablet (5 mg total) by mouth 2 (two) times daily before a meal. What changed:  You were already taking a medication with the same name, and this prescription was added. Make sure you understand how and when to take each.   lactulose 10 GM/15ML solution Commonly known as:  CHRONULAC Take 45 mLs (30 g total) by mouth 3 (three) times daily. What changed:  Another medication with the same name was added. Make sure you understand how and when to take each.   lactulose 10 GM/15ML solution Commonly  known as:  CHRONULAC Take 45 mLs (30 g total) by mouth 3 (three) times daily. What changed:  You were already taking a medication with the same name, and this prescription was added. Make sure you understand how and when to take each.   pantoprazole 40 MG tablet Commonly known as:  PROTONIX Take 40 mg by mouth 2 (two) times daily.   rifaximin 550 MG Tabs tablet Commonly known as:  XIFAXAN Take 1 tablet (550 mg total) by mouth 2 (two) times daily.   thiamine 100 MG tablet Take 1 tablet (100 mg total) by mouth daily.   vitamin B-12 1000 MCG tablet Commonly known as:  CYANOCOBALAMIN Take 1,000 mcg by mouth daily.       Disposition and follow-up:   Henry Solomon [facility].  At the hospital follow up visit please address:  1. Medication compliance of Lactulose   2.  Labs / imaging needed at time of follow-up: none  3.  Pending labs/ test needing follow-up: none  Follow-up Appointments: Follow-up Information    [facility] On 09/10/2015.   Contact information: [address] [place] [id number] [phone]          Hospital Course by problem list: 1. Hepatic encephalopathy [facility](HCC) Henry Solomon is a 55 yo male with PMHx of chronic hepatitis B complicated by cirrhosis and hepatic encephalopathy  and type 2 diabetes who presents to the emergency department with a 2 day history of increasing confusion.  He was recently discharge earlier in the month for hepatic encephalopathy.  Patient was discharged with rifaximin and lactulose.  Patient's mother could only afford the lactulose.  Patient ran out of the medication Sunday 7/16 and patient was admitted 7/20 for confusion.  Asterixis was noted on admission and ammonium levels were elevated at 100.  Lactulose 30 grams TID, titrated to 3-4 bowel movements per day was resumed.  And rifaximin 550 mg BID was started.  The following day patient was  A&O x3 and no asterixis noted.  He appeared at baseline and was eating and conversing well.  Spoke with mother in the afternoon and she stated she could not afford the lactulose bottle.  Case manager was present and the prescription of lactulose was faxed to [facility] in hopes the patient can receive the lactulose prescription for free or discounted price.  Patient was given 11 refills for the year in hopes this will help patient not run out unexpectedly. Patient will be staying with his mother in [place] at this time.  Patient was given an appointment to establish care with [facility], if patient begins to receive care at [facility] he may stay with them.        Diabetes mellitus without complication (HCC) During admission home medications were continued. These medications included glipizide 5 mg BID and gabapentin  TID. Will need outpatient management with established PCP.   Chronic Hepatitis B virus - May need referral with Infectious Disease once established care with PCP.  Discharge Vitals:   BP (!) 155/73 (BP Location: Right Arm)   Pulse 86   Temp 99.1 F (37.3 C)   Resp 18   Ht  (1.753 m)   Wt 135 lb (61.2 kg)   SpO2 100%   BMI 19.94 kg/m     Discharge Instructions: Discharge Instructions    Diet - low sodium heart healthy    Complete by:  As directed   Discharge instructions    Complete by:  As directed   Henry Solomon please take your lactulose as prescribed.  You have 11 refills.  If you run out of your medication you may return to your pharmacy to obtain more.  You should be having 3 bowel movements daily.  You have a follow up appointment at [facility] on August 4th at 1:15pm   Increase activity slowly    Complete by:  As directed      Signed: Camelia Phenes, DO 08/30/2015, 7:50 PM   Pager: [phone]

## 2015-08-30 MED FILL — GABAPENTIN 300 MG CAPSULE: 300 | 30 days supply | Qty: 90 | Fill #0

## 2015-08-30 MED FILL — LACTULOSE 10 GM/15 ML SOLN: 10 | 2 days supply | Qty: 240 | Fill #1

## 2015-09-02 MED FILL — LACTULOSE 10 GM/15 ML SOLN: 10 | 18 days supply | Qty: 2365 | Fill #2

## 2015-09-10 ENCOUNTER — Ambulatory Visit (INDEPENDENT_AMBULATORY_CARE_PROVIDER_SITE_OTHER): Payer: Self-pay | Admitting: Internal Medicine

## 2015-09-10 DIAGNOSIS — E119 Type 2 diabetes mellitus without complications: Secondary | ICD-10-CM

## 2015-09-10 DIAGNOSIS — Z599 Problem related to housing and economic circumstances, unspecified: Secondary | ICD-10-CM

## 2015-09-10 DIAGNOSIS — K7682 Hepatic encephalopathy: Secondary | ICD-10-CM

## 2015-09-10 DIAGNOSIS — Z598 Other problems related to housing and economic circumstances: Secondary | ICD-10-CM

## 2015-09-10 DIAGNOSIS — Z7984 Long term (current) use of oral hypoglycemic drugs: Secondary | ICD-10-CM

## 2015-09-10 DIAGNOSIS — I10 Essential (primary) hypertension: Secondary | ICD-10-CM

## 2015-09-10 DIAGNOSIS — M25551 Pain in right hip: Secondary | ICD-10-CM

## 2015-09-10 DIAGNOSIS — K7290 Hepatic failure, unspecified without coma: Secondary | ICD-10-CM

## 2015-09-10 MED ORDER — LACTULOSE 10 GM/15ML PO SOLN: 30.0000 g | Freq: Three times a day (TID) | ORAL | 11 refills | Status: DC

## 2015-09-10 MED ORDER — GLUCOSE BLOOD VI STRP: ORAL_STRIP | 11 refills | Status: DC

## 2015-09-10 MED ORDER — METFORMIN HCL 500 MG PO TABS: 500.0000 mg | ORAL_TABLET | Freq: Every day | ORAL | 0 refills | Status: DC

## 2015-09-10 MED ORDER — AMLODIPINE BESYLATE 10 MG PO TABS: 10.0000 mg | ORAL_TABLET | Freq: Every day | ORAL | 1 refills | Status: DC

## 2015-09-10 NOTE — Patient Instructions (Signed)
Mr. Felman  1) You currently have Amlodipine 5mg  once daily.  Please take two pills Amlodipine 5mg  until you finish, then new prescription will be 10mg  once daily  2) Please take Metformin  Week 1 - Metformin 500mg  1 pill once a day Week 2 - Metformin 500mg   1 pill twice a day Week 3 -  Metformin 500mg  2 pills twice a day Continue 2 pills twice a day until follow up visit   3)  You will need to go upstairs on the 2nd floor for X ray of right hip 4)  You will be called for appointment to see Sports medicine for possible joint injection 5)  Please make an appointment with Rudell Cobb for financial counsel 6) Lactulose can be taken twice a day and having 3 bowel movements if not having 3 bowel movements then need to take 3 times a day.

## 2015-09-13 MED FILL — ?AMLODIPINE BESYLATE 10 MG: 10 | 30 days supply | Qty: 30 | Fill #0

## 2015-09-13 MED FILL — metFORMIN HCL 500 MG TABS: 500 | 30 days supply | Qty: 30 | Fill #0

## 2015-09-15 ENCOUNTER — Telehealth: Payer: Self-pay | Admitting: *Deleted

## 2015-09-15 ENCOUNTER — Inpatient Hospital Stay (HOSPITAL_COMMUNITY)
Admission: EM | Admit: 2015-09-15 | Discharge: 2015-09-17 | DRG: 442 | Disposition: A | Discharge status: Home / Self Care | Payer: Medicaid Other | Attending: Student in an Organized Health Care Education/Training Program | Admitting: Student in an Organized Health Care Education/Training Program

## 2015-09-15 ENCOUNTER — Encounter (HOSPITAL_COMMUNITY): Payer: Self-pay | Admitting: Emergency Medicine

## 2015-09-15 ENCOUNTER — Other Ambulatory Visit: Payer: Self-pay

## 2015-09-15 DIAGNOSIS — K219 Gastro-esophageal reflux disease without esophagitis: Secondary | ICD-10-CM | POA: Diagnosis present

## 2015-09-15 DIAGNOSIS — M25559 Pain in unspecified hip: Secondary | ICD-10-CM | POA: Insufficient documentation

## 2015-09-15 DIAGNOSIS — N189 Chronic kidney disease, unspecified: Secondary | ICD-10-CM | POA: Diagnosis present

## 2015-09-15 DIAGNOSIS — K7460 Unspecified cirrhosis of liver: Secondary | ICD-10-CM | POA: Diagnosis present

## 2015-09-15 DIAGNOSIS — Z8739 Personal history of other diseases of the musculoskeletal system and connective tissue: Secondary | ICD-10-CM

## 2015-09-15 DIAGNOSIS — E1122 Type 2 diabetes mellitus with diabetic chronic kidney disease: Secondary | ICD-10-CM | POA: Diagnosis present

## 2015-09-15 DIAGNOSIS — E1165 Type 2 diabetes mellitus with hyperglycemia: Secondary | ICD-10-CM | POA: Diagnosis present

## 2015-09-15 DIAGNOSIS — I10 Essential (primary) hypertension: Secondary | ICD-10-CM | POA: Insufficient documentation

## 2015-09-15 DIAGNOSIS — Z87891 Personal history of nicotine dependence: Secondary | ICD-10-CM | POA: Diagnosis not present

## 2015-09-15 DIAGNOSIS — E860 Dehydration: Secondary | ICD-10-CM | POA: Diagnosis present

## 2015-09-15 DIAGNOSIS — Z7984 Long term (current) use of oral hypoglycemic drugs: Secondary | ICD-10-CM

## 2015-09-15 DIAGNOSIS — I129 Hypertensive chronic kidney disease with stage 1 through stage 4 chronic kidney disease, or unspecified chronic kidney disease: Secondary | ICD-10-CM | POA: Diagnosis present

## 2015-09-15 DIAGNOSIS — K7290 Hepatic failure, unspecified without coma: Secondary | ICD-10-CM | POA: Diagnosis not present

## 2015-09-15 DIAGNOSIS — Z96641 Presence of right artificial hip joint: Secondary | ICD-10-CM | POA: Diagnosis present

## 2015-09-15 DIAGNOSIS — B181 Chronic viral hepatitis B without delta-agent: Secondary | ICD-10-CM | POA: Diagnosis present

## 2015-09-15 DIAGNOSIS — R739 Hyperglycemia, unspecified: Secondary | ICD-10-CM | POA: Insufficient documentation

## 2015-09-15 DIAGNOSIS — Z599 Problem related to housing and economic circumstances, unspecified: Secondary | ICD-10-CM | POA: Insufficient documentation

## 2015-09-15 DIAGNOSIS — Z598 Other problems related to housing and economic circumstances: Secondary | ICD-10-CM | POA: Insufficient documentation

## 2015-09-15 DIAGNOSIS — R4182 Altered mental status, unspecified: Secondary | ICD-10-CM | POA: Diagnosis present

## 2015-09-15 DIAGNOSIS — E119 Type 2 diabetes mellitus without complications: Secondary | ICD-10-CM

## 2015-09-15 DIAGNOSIS — Z79899 Other long term (current) drug therapy: Secondary | ICD-10-CM | POA: Diagnosis not present

## 2015-09-15 DIAGNOSIS — M869 Osteomyelitis, unspecified: Secondary | ICD-10-CM

## 2015-09-15 DIAGNOSIS — K7682 Hepatic encephalopathy: Secondary | ICD-10-CM | POA: Diagnosis present

## 2015-09-15 LAB — CBC WITH DIFFERENTIAL/PLATELET
Basophils Absolute: 0 10*3/uL (ref 0.0–0.1)
Basophils Relative: 0 %
EOS ABS: 0.1 10*3/uL (ref 0.0–0.7)
EOS PCT: 1 %
HCT: 38.3 % — ABNORMAL LOW (ref 39.0–52.0)
HEMOGLOBIN: 12.6 g/dL — AB (ref 13.0–17.0)
LYMPHS ABS: 1.8 10*3/uL (ref 0.7–4.0)
LYMPHS PCT: 31 %
MCH: 30.4 pg (ref 26.0–34.0)
MCHC: 32.9 g/dL (ref 30.0–36.0)
MCV: 92.5 fL (ref 78.0–100.0)
MONOS PCT: 9 %
Monocytes Absolute: 0.5 10*3/uL (ref 0.1–1.0)
NEUTROS PCT: 59 %
Neutro Abs: 3.4 10*3/uL (ref 1.7–7.7)
Platelets: DECREASED 10*3/uL (ref 150–400)
RBC: 4.14 MIL/uL — ABNORMAL LOW (ref 4.22–5.81)
RDW: 14.1 % (ref 11.5–15.5)
WBC: 5.8 10*3/uL (ref 4.0–10.5)

## 2015-09-15 LAB — URINALYSIS, ROUTINE W REFLEX MICROSCOPIC
Bilirubin Urine: NEGATIVE
Ketones, ur: NEGATIVE mg/dL
Leukocytes, UA: NEGATIVE
Nitrite: NEGATIVE
Protein, ur: 100 mg/dL — AB
SPECIFIC GRAVITY, URINE: 1.024 (ref 1.005–1.030)
pH: 6.5 (ref 5.0–8.0)

## 2015-09-15 LAB — COMPREHENSIVE METABOLIC PANEL
ALK PHOS: 170 U/L — AB (ref 38–126)
ALT: 171 U/L — AB (ref 17–63)
ANION GAP: 6 (ref 5–15)
AST: 226 U/L — ABNORMAL HIGH (ref 15–41)
Albumin: 2.4 g/dL — ABNORMAL LOW (ref 3.5–5.0)
BUN: 21 mg/dL — ABNORMAL HIGH (ref 6–20)
CALCIUM: 9.1 mg/dL (ref 8.9–10.3)
CO2: 18 mmol/L — AB (ref 22–32)
CREATININE: 1.51 mg/dL — AB (ref 0.61–1.24)
Chloride: 112 mmol/L — ABNORMAL HIGH (ref 101–111)
GFR, EST AFRICAN AMERICAN: 59 mL/min — AB (ref >60)
GFR, EST NON AFRICAN AMERICAN: 51 mL/min — AB (ref >60)
Glucose, Bld: 453 mg/dL — ABNORMAL HIGH (ref 65–99)
Potassium: 4.6 mmol/L (ref 3.5–5.1)
SODIUM: 136 mmol/L (ref 135–145)
TOTAL PROTEIN: 7.4 g/dL (ref 6.5–8.1)
Total Bilirubin: 0.9 mg/dL (ref 0.3–1.2)

## 2015-09-15 LAB — ETHANOL: Alcohol, Ethyl (B): <5 mg/dL (ref <5)

## 2015-09-15 LAB — URINE MICROSCOPIC-ADD ON

## 2015-09-15 LAB — AMMONIA: Ammonia: 108 umol/L — ABNORMAL HIGH (ref 9–35)

## 2015-09-15 LAB — CBG MONITORING, ED
Glucose-Capillary: 476 mg/dL — ABNORMAL HIGH (ref 65–99)
Glucose-Capillary: 222 mg/dL — ABNORMAL HIGH (ref 65–99)

## 2015-09-15 MED ORDER — RIFAXIMIN 550 MG PO TABS: 550.0000 mg | ORAL_TABLET | Freq: Two times a day (BID) | ORAL | Status: DC

## 2015-09-15 MED ORDER — INSULIN ASPART 100 UNIT/ML ~~LOC~~ SOLN
0.0000 [IU] | Freq: Three times a day (TID) | SUBCUTANEOUS | Status: DC
  Administered 2015-09-16: 3 [IU] via SUBCUTANEOUS
  Administered 2015-09-16: 9 [IU] via SUBCUTANEOUS
  Administered 2015-09-16: 7 [IU] via SUBCUTANEOUS
  Administered 2015-09-17: 3 [IU] via SUBCUTANEOUS

## 2015-09-15 MED ORDER — INSULIN ASPART 100 UNIT/ML ~~LOC~~ SOLN
6.0000 [IU] | Freq: Once | SUBCUTANEOUS | Status: AC
  Administered 2015-09-15: 6 [IU] via INTRAVENOUS
  Filled 2015-09-15: qty 1

## 2015-09-15 MED ORDER — SODIUM CHLORIDE 0.9 % IV BOLUS (SEPSIS)
1000.0000 mL | Freq: Once | INTRAVENOUS | Status: AC
  Administered 2015-09-15: 1000 mL via INTRAVENOUS

## 2015-09-15 MED ORDER — GLIPIZIDE 5 MG PO TABS
5.0000 mg | ORAL_TABLET | Freq: Two times a day (BID) | ORAL | Status: DC
  Administered 2015-09-16 – 2015-09-17 (×4): 5 mg via ORAL
  Filled 2015-09-15 (×6): qty 1

## 2015-09-15 MED ORDER — LACTULOSE 10 GM/15ML PO SOLN
30.0000 g | Freq: Three times a day (TID) | ORAL | Status: DC
  Administered 2015-09-15 – 2015-09-17 (×6): 30 g via ORAL
  Filled 2015-09-15 (×8): qty 45

## 2015-09-15 MED ORDER — GABAPENTIN 300 MG PO CAPS
300.0000 mg | ORAL_CAPSULE | Freq: Three times a day (TID) | ORAL | Status: DC
  Administered 2015-09-15 – 2015-09-17 (×6): 300 mg via ORAL
  Filled 2015-09-15 (×6): qty 1

## 2015-09-15 MED ORDER — VITAMIN B-12 1000 MCG PO TABS
1000.0000 ug | ORAL_TABLET | Freq: Every day | ORAL | Status: DC
  Administered 2015-09-15 – 2015-09-17 (×3): 1000 ug via ORAL
  Filled 2015-09-15 (×3): qty 1

## 2015-09-15 MED ORDER — LACTULOSE 10 GM/15ML PO SOLN: 30.0000 g | Freq: Three times a day (TID) | ORAL | Status: DC

## 2015-09-15 MED ORDER — AMLODIPINE BESYLATE 10 MG PO TABS
10.0000 mg | ORAL_TABLET | Freq: Every day | ORAL | Status: DC
  Administered 2015-09-16 – 2015-09-17 (×2): 10 mg via ORAL
  Filled 2015-09-15 (×2): qty 1

## 2015-09-15 MED ORDER — THIAMINE HCL 100 MG PO TABS: 100.0000 mg | ORAL_TABLET | Freq: Every day | ORAL | Status: DC

## 2015-09-15 MED ORDER — ENOXAPARIN SODIUM 40 MG/0.4ML ~~LOC~~ SOLN
40.0000 mg | SUBCUTANEOUS | Status: DC
  Administered 2015-09-15 – 2015-09-16 (×2): 40 mg via SUBCUTANEOUS
  Filled 2015-09-15 (×2): qty 0.4

## 2015-09-15 MED ORDER — FOLIC ACID 1 MG PO TABS
1.0000 mg | ORAL_TABLET | Freq: Every day | ORAL | Status: DC
  Administered 2015-09-16 – 2015-09-17 (×2): 1 mg via ORAL
  Filled 2015-09-15 (×2): qty 1

## 2015-09-15 MED ORDER — PANTOPRAZOLE SODIUM 40 MG PO TBEC
40.0000 mg | DELAYED_RELEASE_TABLET | Freq: Two times a day (BID) | ORAL | Status: DC
  Administered 2015-09-15 – 2015-09-17 (×4): 40 mg via ORAL
  Filled 2015-09-15 (×4): qty 1

## 2015-09-15 NOTE — ED Provider Notes (Signed)
[facility]-EM[facility]Y DEPT Provider Note   CSN: [id number] Arrival date & time: 09/15/15  1128  First Provider Contact:  First MD Initiated Contact with Patient 09/15/15 1206        History   Chief Complaint Chief Complaint  Patient presents with  . Altered Mental Status    HPI Henry Solomon is a 55 y.o. male.  The history is provided by the EMS personnel and a friend.  Altered Mental Status   This is a recurrent problem. Episode onset: 3 days. The problem has been gradually worsening. Associated symptoms include confusion and somnolence. His past medical history is significant for liver disease.    Past Medical History:  Diagnosis Date  . Chronic lower back pain   . GERD (gastroesophageal reflux disease)   . Hepatitis B   . Hypertension   . Tuberculosis    "earlier this year; got it cleared up w/treatment" (08/05/2015)  . Type II diabetes mellitus [facility])     Patient Active Problem List   Diagnosis Date Noted  . Hepatic encephalopathy (HCC)   . Chronic hepatitis B (HCC)   . Cirrhosis of liver without ascites (HCC)   . Malnutrition of moderate degree 08/07/2015  . Diabetes mellitus without complication (HCC) 08/05/2015    Past Surgical History:  Procedure Laterality Date  . JOINT REPLACEMENT    . TOTAL HIP ARTHROPLASTY Right 1978       Home Medications    Prior to Admission medications   Medication Sig Start Date End Date Taking? Authorizing Provider  amLODipine (NORVASC) 10 MG tablet Take 1 tablet (10 mg total) by mouth daily. 09/10/15 09/09/16  Camelia Phenes, DO  Calcium-Magnesium-Zinc (CAL-MAG-ZINC PO) Take 1 tablet by mouth daily.    Historical Provider, MD  cholecalciferol (VITAMIN D) 1000 units tablet Take 1,000 Units by mouth daily.    Historical Provider, MD  feeding supplement, GLUCERNA SHAKE, (GLUCERNA SHAKE) LIQD Take 237 mLs by mouth 3 (three) times daily between meals. 08/09/15   Jessica Ratliff Hoffman, DO  Flaxseed, Linseed, (FLAX SEEDS PO) Take  1 capsule by mouth daily.    Historical Provider, MD  folic acid (FOLVITE) 1 MG tablet Take 1 tablet (1 mg total) by mouth daily. 08/09/15   Camelia Phenes, DO  gabapentin (NEURONTIN) 300 MG capsule Take 1 capsule (300 mg total) by mouth 3 (three) times daily. 08/23/15   Tharon Aquas, PA  glipiZIDE (GLUCOTROL) 5 MG tablet Take 1 tablet (5 mg total) by mouth 2 (two) times daily before a meal. 08/27/15   Camelia Phenes, DO  glucose blood test strip Use as instructed 09/10/15   Camelia Phenes, DO  lactulose (CHRONULAC) 10 GM/15ML solution Take 45 mLs (30 g total) by mouth 3 (three) times daily. Can Take 2 times daily if having 3 bowel movements 09/10/15   Camelia Phenes, DO  metFORMIN (GLUCOPHAGE) 500 MG tablet Take 1 tablet (500 mg total) by mouth daily with breakfast. 09/10/15 09/09/16  Camelia Phenes, DO  pantoprazole (PROTONIX) 40 MG tablet Take 40 mg by mouth 2 (two) times daily.    Historical Provider, MD  rifaximin (XIFAXAN) 550 MG TABS tablet Take 1 tablet (550 mg total) by mouth 2 (two) times daily. 08/09/15   Camelia Phenes, DO  thiamine 100 MG tablet Take 1 tablet (100 mg total) by mouth daily. 08/09/15   Camelia Phenes, DO  vitamin B-12 (CYANOCOBALAMIN) 1000 MCG tablet Take 1,000 mcg by mouth daily.  Historical Provider, MD    Family History History reviewed. No pertinent family history.  Social History Social History  Substance Use Topics  . Smoking status: Former Smoker    Packs/day: 0.10    Years: 43.00    Types: Cigarettes    Quit date: 01/07/2015  . Smokeless tobacco: Never Used  . Alcohol use No     Allergies   Review of patient's allergies indicates no known allergies.   Review of Systems Review of Systems  Unable to perform ROS: Mental status change  Psychiatric/Behavioral: Positive for confusion.  Level 5 exception to history: altered    Physical Exam Updated Vital Signs BP 153/93 (BP Location: Right Arm)    Pulse 76   Temp 98.9 F (37.2 C) (Oral)   Resp (!) 0   SpO2 100%   Physical Exam  Constitutional: He appears well-developed. He appears listless. No distress.  HENT:  Head: Normocephalic and atraumatic.  Eyes: Conjunctivae are normal.  Neck: Neck supple. No tracheal deviation present.  Cardiovascular: Normal rate, regular rhythm and normal heart sounds.   Pulmonary/Chest: Effort normal and breath sounds normal. No respiratory distress.  Abdominal: Soft. He exhibits no distension. There is no tenderness. There is no guarding.  Neurological: He appears listless. He is disoriented. No cranial nerve deficit. GCS eye subscore is 4. GCS verbal subscore is 4. GCS motor subscore is 5.  Difficulty following single step commands  Skin: Skin is warm and dry.  Psychiatric: He has a normal mood and affect. His speech is delayed and slurred. He is slowed.  Perseverating about back pain, answering questions inappropriately  Vitals reviewed.    ED Treatments / Results  Labs (all labs ordered are listed, but only abnormal results are displayed) Labs Reviewed  CBC WITH DIFFERENTIAL/PLATELET - Abnormal; Notable for the following:       Result Value   RBC 4.14 (*)    Hemoglobin 12.6 (*)    HCT 38.3 (*)    All other components within normal limits  COMPREHENSIVE METABOLIC PANEL - Abnormal; Notable for the following:    Chloride 112 (*)    CO2 18 (*)    Glucose, Bld 453 (*)    BUN 21 (*)    Creatinine, Ser 1.51 (*)    Albumin 2.4 (*)    AST 226 (*)    ALT 171 (*)    Alkaline Phosphatase 170 (*)    GFR calc non Af Amer 51 (*)    GFR calc Af Amer 59 (*)    All other components within normal limits  URINALYSIS, ROUTINE W REFLEX MICROSCOPIC (NOT AT [facility]) - Abnormal; Notable for the following:    Glucose, UA >1000 (*)    Hgb urine dipstick TRACE (*)    Protein, ur 100 (*)    All other components within normal limits  AMMONIA - Abnormal; Notable for the following:    Ammonia 108 (*)     All other components within normal limits  URINE MICROSCOPIC-ADD ON - Abnormal; Notable for the following:    Squamous Epithelial / LPF 0-5 (*)    Bacteria, UA RARE (*)    All other components within normal limits  ETHANOL  CBG MONITORING, ED    EKG  EKG Interpretation  Date/Time:  Wednesday September 15 2015 11:36:05 EDT Ventricular Rate:  74 PR Interval:    QRS Duration: 83 QT Interval:  491 QTC Calculation: 545 R Axis:   7 Text Interpretation:  Sinus rhythm Borderline T wave  abnormalities No significant change since last tracing Confirmed by Kristain Hu MD, Desera Graffeo [phone]) on 09/15/2015 11:39:27 AM       Radiology No results found.  Procedures Procedures (including critical care time)  Medications Ordered in ED Medications  amLODipine (NORVASC) tablet 10 mg (not administered)  gabapentin (NEURONTIN) capsule 300 mg (not administered)  folic acid (FOLVITE) tablet 1 mg (not administered)  pantoprazole (PROTONIX) EC tablet 40 mg (not administered)  vitamin B-12 (CYANOCOBALAMIN) tablet 1,000 mcg (not administered)  enoxaparin (LOVENOX) injection 40 mg (not administered)  lactulose (CHRONULAC) 10 GM/15ML solution 30 g (not administered)  glipiZIDE (GLUCOTROL) tablet 5 mg (not administered)  insulin aspart (novoLOG) injection 0-9 Units (not administered)  sodium chloride 0.9 % bolus 1,000 mL (0 mLs Intravenous Stopped 09/15/15 1528)  insulin aspart (novoLOG) injection 6 Units (6 Units Intravenous Given 09/15/15 1420)     Initial Impression / Assessment and Plan / ED Course  I have reviewed the triage vital signs and the nursing notes.  Pertinent labs & imaging results that were available during my care of the patient were reviewed by me and considered in my medical decision making (see chart for details).  Clinical Course    55 y.o. male presents with AMS worsening over the last 3 days per family and girlfriend. They are unsure if he has been taking his medications but he has had a  steep decline similar to prior hepatic encephalopathy episodes. Today's episode appears very similar to 2 prior admission last month where he improved with lactulose and rifaximin. No focal deficits but difficult to examine because of level of AMS. Given IVF bolus for clinically apparent dehydration likely 2/2 poor intake. Internal medicine was consulted for admission and will see the patient in the emergency department.   Final Clinical Impressions(s) / ED Diagnoses   Final diagnoses:  Hepatic encephalopathy (HCC)  Hyperglycemia without ketosis  Dehydration, moderate    New Prescriptions New Prescriptions   No medications on file     Lyndal Pulley, MD 09/15/15 2121

## 2015-09-15 NOTE — Assessment & Plan Note (Signed)
Assessment:  Essential Hypertension Patient remains uncontrolled at 143/82  Plan Increase amlodipine from  to  daily Reassess blood pressure on next visit

## 2015-09-15 NOTE — ED Triage Notes (Signed)
EMS advised that the girlfriend said he is more altered than usual and has been this way for three days.  EMS does not know his "normal" baseline.  He has liver issues and complains of his abdomen being distended.  His girlfriend said he has done this before where he wandered off and they had to put out a silver alert.  Patient denies any pain.

## 2015-09-15 NOTE — Assessment & Plan Note (Signed)
Assessment:  Right hip pain Questionable history of falls.  Patient's exam suggests greater trochanter bursitis.  Plan - Sport medicine referral for joint injection - X-ray of right hip

## 2015-09-15 NOTE — Assessment & Plan Note (Signed)
Assessment: DM type II Pervious A1C on 08/06/15 was 11.7  Plan - Continue to take Glipizide 5mg  BID - Patient is unable to administer his own medications.  Patient relies on mother but she is not always available.  Insulin would not be appropriate.  Metformin has been added

## 2015-09-15 NOTE — ED Notes (Signed)
Placed condom cath on pt, nurse approved.

## 2015-09-15 NOTE — ED Notes (Signed)
Paged Dr. Erlinda Honguncan Vincent to [place], RN

## 2015-09-15 NOTE — ED Notes (Signed)
Family at bedside. 

## 2015-09-15 NOTE — Progress Notes (Signed)
   CC: Hospital follow up for hepatic encephalopathy  HPI:  [name] is a 55 y.o. male with PMHx of hepatic encephalopathy and DM type II that [facility] care and follow up for hospitalization of hepatic encephalopathy.  Mother is the caregiver and present today.  Mother expressed concern in affording medications as patient does not have insurance. This was the cause of the most recent hospitalization as she could not afford lactulose.  Therefore, patient may be non compliant on medications.  However, mother states patient has been taking lactulose as prescribed and having 4-5 bowel movements a day since discharge.  Patient also complains of right sided hip pain that has gotten worse in the past 2-3 weeks.  While living in [place] recently to live with his mother) he was in a facility and was considered a fall risk.  Mother thinks patient has fallen while there.  Patient is unable to recollect any trauma or falls to the hip.  He has to use a walker to ambulate.  He states he has pains that run down the lateral aspect of the thigh stopping above the knee.  He denies saddle paresthesias, bowel or urinary incontinence.    Past Medical History:  Diagnosis Date  . Chronic lower back pain   . GERD (gastroesophageal reflux disease)   . Hepatitis B   . Hypertension   . Tuberculosis    "earlier this year; got it cleared up w/treatment" (08/05/2015)  . Type II diabetes mellitus (HCC)     Review of Systems:  Review of Systems  Eyes: Negative for blurred vision.  Respiratory: Negative for shortness of breath.   Cardiovascular: Negative for chest pain.  Gastrointestinal: Negative for abdominal pain.  Musculoskeletal: Positive for back pain.  Neurological: Positive for weakness. Negative for dizziness and tremors.    Physical Exam:  There were no vitals filed for this visit. Physical Exam  Constitutional: He is well-developed,  well-nourished, and in no distress.  HENT:  Head: Normocephalic and atraumatic.  Cardiovascular: Normal rate and regular rhythm.   Pulmonary/Chest: Effort normal and breath sounds normal.  Abdominal: Soft. He exhibits no distension. There is no tenderness.  Musculoskeletal: He exhibits no edema.  Motor strength 5/5 upper and lower extremities bilaterally Point tenderness over greater trochanter Pain elicited on external rotation and abduction on passive motion     Assessment & Plan:   See encounters tab for problem based medical decision making.  I spent 60 minutes face to face with the patient. Greater than 50% of the time was spent on counseling and coordination of care, which is detailed in the A&P.    Patient seen with Dr. Josem KaufmannKlima

## 2015-09-15 NOTE — H&P (Signed)
Date: 09/15/2015               Patient Name:  Henry Solomon MRN: [id number]  DOB: Jan 06, 1961 Age / Sex: 55 y.o., male   PCP: [name], NP         Medical Service: Internal Medicine Teaching Service         Attending Physician: Dr. Tyson Alias, MD    First Contact: Dr. Thomasene Lot, MD Pager: [phone]  Second Contact: Dr. Elyn Peers Ahmed,MD Pager: [phone]       After Hours (After 5p/  First Contact Pager: [phone]  weekends / holidays): Second Contact Pager: [phone]   Chief Complaint: Altered mental status  History of Present Illness: [name] is a 55 year old male with a past medical history of chronic hepatitis B, cirrhosis, type 2 diabetes and hepatic encephalopathy who presented to the emergency department with a 5 day history of altered mental status. Currently, the patient lives at home with his mother. The majority of the history of present illness was obtained from the patient's mother given the patient's current mental status. The patient's mother states that he had a clinic appointment in the [facility] on Friday 08/10/2015 at which time the patient was instructed to reduce the amount of his lactulose 3 times daily to 2 times daily. Additionally, at this appointment the patient's amlodipine was increased from 5 mg once daily to 10 mg once daily. The patient followed these directions and decreased his lactulose intake to 2 times daily. Monday morning of this week the patient became altered and was struggling with activities of daily living including dressing himself. His mother states that over the course of the week his symptoms progressively worsened and the patient was having trouble eating, dressing himself and walking. Additionally, the patient does not talk much at baseline but his speech has been decreased as well over the last week. This morning the patient's mother noted that he was more altered and was not oriented so she called the paramedics who brought him to the  emergency department.   The patient's mother states that he has been taking his lactulose every day, twice daily as prescribed. However, when I discussed if he was using his rifaximin the patient's mother said that this medication was too expensive and that he has not been on this for the treatment of his encephalopathy.  In the emergency department the patient was afebrile, hemodynamically stable and in no acute distress. CBC with differential did not demonstrate leukocytosis and was largely within normal limits. Comprehensive metabolic panel was significant for glucose of 453, CO2 of 18, AST of 226, ALT of 171 and alkaline phosphatase of 170. The patient's ammonia level was 108. Urinalysis demonstrated a glucose greater than 1000 and protein of 100. He was given a 1 L normal saline bolus and 6 units of insulin aspart for hyperglycemia. No imaging studies were ordered in the emergency department.   Meds:  Current Meds  Medication Sig  . amLODipine (NORVASC) 10 MG tablet Take 1 tablet (10 mg total) by mouth daily.  . Calcium-Magnesium-Zinc (CAL-MAG-ZINC PO) Take 1 tablet by mouth daily.  . cholecalciferol (VITAMIN D) 1000 units tablet Take 1,000 Units by mouth daily.  . Flaxseed, Linseed, (FLAX SEEDS PO) Take 1 capsule by mouth daily.  . folic acid (FOLVITE) 1 MG tablet Take 1 tablet (1 mg total) by mouth daily.  Marland Kitchen gabapentin (NEURONTIN) 300 MG capsule Take 1 capsule (300 mg total) by mouth 3 (  three) times daily. (Patient taking differently: Take 300 mg by mouth at bedtime. )  . glipiZIDE (GLUCOTROL) 5 MG tablet Take 1 tablet (5 mg total) by mouth 2 (two) times daily before a meal. (Patient taking differently: Take 5 mg by mouth every evening. )  . glucose blood test strip Use as instructed  . lactulose (CHRONULAC) 10 GM/15ML solution Take 45 mLs (30 g total) by mouth 3 (three) times daily. Can Take 2 times daily if having 3 bowel movements  . metFORMIN (GLUCOPHAGE) 500 MG tablet Take 1 tablet  (500 mg total) by mouth daily with breakfast.  . pantoprazole (PROTONIX) 40 MG tablet Take 40 mg by mouth 2 (two) times daily.  . vitamin B-12 (CYANOCOBALAMIN) 1000 MCG tablet Take 1,000 mcg by mouth daily.  . [DISCONTINUED] rifaximin (XIFAXAN) 550 MG TABS tablet Take 1 tablet (550 mg total) by mouth 2 (two) times daily.     Allergies: Allergies as of 09/15/2015  . (No Known Allergies)   Past Medical History:  Diagnosis Date  . Chronic lower back pain   . GERD (gastroesophageal reflux disease)   . Hepatitis B   . Hypertension   . Tuberculosis    "earlier this year; got it cleared up w/treatment" (08/05/2015)  . Type II diabetes mellitus (HCC)     Family History: Positive for diabetes, chronic kidney disease and hypertension  Social History: Denies alcohol, tobacco or illicit drug use  Review of Systems: A complete ROS was negative except as per HPI.   Physical Exam: Blood pressure 150/96, pulse 79, temperature 98.9 F (37.2 C), temperature source Oral, resp. rate 18, SpO2 100 %. Physical Exam  Constitutional: He appears well-developed and well-nourished.  In no acute distress  HENT:  Head: Normocephalic and atraumatic.  No scleral icterus, grayish dark lesion on the right side of the patient's lingual tongue  Cardiovascular: Normal rate and regular rhythm.  Exam reveals no friction rub.   No murmur heard. Respiratory: Effort normal and breath sounds normal. No respiratory distress. He has no wheezes. He has no rales.  GI: Soft. Bowel sounds are normal. He exhibits no distension. There is no tenderness.  No abdominal bruits auscultated  Musculoskeletal: He exhibits no edema or deformity.  Neurological: He is alert. No cranial nerve deficit.  Oriented to president, asterixis present  Skin: Skin is dry. No rash noted. No erythema.     EKG: Sinus rhythm  CXR: None obtained  Assessment & Plan by Problem: [name] is a 55 year old male with a past medical history  chronic hepatitis B, cirrhosis, type 2 diabetes mellitus and hepatic encephalopathy who presents with a 5 day history of confusion and altered mental status likely secondary to hepatic encephalopathy in the setting of decreased lactulose dosage.  1. AMS 2/2 hepatic encephalopathy -- Patient presenting with worsening altered mental status following reduction in lactulose dose. Also not currently taking prescribed rifaximin. Altered mental status is most likely not 2/2 stroke given the patients normal neurological examination. Spontaneous bacterial peritonitis should be considered in the differential diagnosis. This is less likely given no history of ascites and normal abdominal exam without rebound, tenderness or guarding. -- Asterixis present on examination -- Ammonia level 108, AST 226, ALT 171 and alkaline phosphatase 170 -- Increase lactulose to 30 g 3 times a day and titrate to 3-4 bowel movements per day -- We will not start rifaximin at this time as we willtry to improve the patient's encephalopathy with lactulose as he is unable to  get this medication in the outpatient setting  2. Hyperglycemia -- Patient type II diabetic -- Glucose on CMP 453, glucose on urinalysis greater than 1000, CO2 18, no anion gap -- Given 6 units of insulin aspart in the emergency room, will repeat CMP -- 1 L normal saline bolus given in the emergency department -- Sliding scale insulin -- Continue glipizide 5 mg twice a day -- Carbohydrate modified diet  3. Chronic hepatitis B virus infection -- [facility] -- We'll need referral to infectious diseases in [place] as patient has recently moved from [place] and currently lives with his mother.   4. DVT/PE prophylaxis -- Lovenox 40 mg subcutaneous injection once daily  Dispo: Admit patient to Observation with expected length of stay less than 2 midnights.  Signed: Thomasene LotJames Shevy Yaney, MD 09/15/2015, 3:33 PM  Pager: [phone]

## 2015-09-15 NOTE — Telephone Encounter (Signed)
"  I would like to tell them in my sons mind he woke up 3 days ago. His mind is gone, I have to feed him and get his clothes on." "He woke up Monday morning and he did not even know he was in this world". "He's getting worse." HX of hepatic encephalopathy. Recommended that patient go to ED. Mother states that she rides the bus and cannot get him to ED. Offered to call EMS and mother said "yes please." EMS called at 1040. EMS in route. Called mother to relay instructions to keep patient turned on his side, not to feed him, put away all pets, and gather all his medications. She agreed.

## 2015-09-15 NOTE — Progress Notes (Signed)
Patient arrived around 2000 A& O 2 to 3 mild confusion but cooperative, no pain, DC'D Telly order will continue to monitor.

## 2015-09-15 NOTE — ED Notes (Signed)
Pt's family member left to go home.

## 2015-09-15 NOTE — Assessment & Plan Note (Signed)
Assessment:  Financial difficulties Patient wants to apply for disability  [facility]

## 2015-09-15 NOTE — H&P (Signed)
ADMISSION HISTORY & PHYSICAL   Chief Complaint: Altered mental status  HPI:  This is a 55 y.o. male with a past medical history significant for chronic hepatitis B and type 2 DM who presents with worsening confusion over the past 3 days. History was given by his mother who also administers his medications. He was described as experiencing increasing confusion starting on 8/7, and the morning of presentation had an involuntary bowel movement and attempted to wear a shirt as pants. No diarrhea, melena, hematochezia. Denies dysuria and hematuria. Patient has not had nausea or vomiting. Denies any fevers or chills. Patient denies chest pain, SOB, and cough. Patient has had been walking with unsteadiness and shaking since the confusion started. Patient presented on 7/20 and 6/29 for episodes of confusion, which were both attributed to hepatic encephalopathy and the patient was administered rifaximin and lactulose. Patient was seen at [facility] on 8/4, where his lactulose was decreased from 30 gm t.i.d. To 30 gm bid and amlodipine increased from 5 mg daily to 10 mg. Patient has baseline of 4 bowel movements per day and has not recently changed. The patient was prescribed rifaximin 550 mg bid, but does not take it due to cost. Patient also complains of R hip pain, which he attributes to bursitis.   PMHx:  Past Medical History:  Diagnosis Date  . Chronic lower back pain   . GERD (gastroesophageal reflux disease)   . Hepatitis B   . Hypertension   . Tuberculosis    "earlier this year; got it cleared up w/treatment" (08/05/2015)  . Type II diabetes mellitus ([place])     Past Surgical History:  Procedure Laterality Date  . JOINT REPLACEMENT    . TOTAL HIP ARTHROPLASTY Right 1978    FAMHx:  Family history of diabetes, cardiovascular disease, CKD, and HTN  SOCHx:   reports that he quit smoking about 8 months ago. His smoking use included Cigarettes. He has a 4.30 pack-year smoking history. He has never  used smokeless tobacco. He reports that he uses drugs, including Cocaine. He reports that he does not drink alcohol.  ALLERGIES:  No Known Allergies  ROS: Pertinent items noted in HPI and remainder of comprehensive ROS otherwise negative.  HOME MEDS:  No current facility-administered medications on file prior to encounter.    Current Outpatient Prescriptions on File Prior to Encounter  Medication Sig Dispense Refill  . amLODipine (NORVASC) 10 MG tablet Take 1 tablet (10 mg total) by mouth daily. 30 tablet 1  . Calcium-Magnesium-Zinc (CAL-MAG-ZINC PO) Take 1 tablet by mouth daily.    . cholecalciferol (VITAMIN D) 1000 units tablet Take 1,000 Units by mouth daily.    . Flaxseed, Linseed, (FLAX SEEDS PO) Take 1 capsule by mouth daily.    . folic acid (FOLVITE) 1 MG tablet Take 1 tablet (1 mg total) by mouth daily. 30 tablet 1  . gabapentin (NEURONTIN) 300 MG capsule Take 1 capsule (300 mg total) by mouth 3 (three) times daily. (Patient taking differently: Take 300 mg by mouth at bedtime. ) 90 capsule 2  . glipiZIDE (GLUCOTROL) 5 MG tablet Take 1 tablet (5 mg total) by mouth 2 (two) times daily before a meal. (Patient taking differently: Take 5 mg by mouth every evening. ) 60 tablet 0  . glucose blood test strip Use as instructed 100 each 11  . lactulose (CHRONULAC) 10 GM/15ML solution Take 45 mLs (30 g total) by mouth 3 (three) times daily. Can Take 2 times daily if  having 3 bowel movements 240 mL 11  . metFORMIN (GLUCOPHAGE) 500 MG tablet Take 1 tablet (500 mg total) by mouth daily with breakfast. 77 tablet 0  . pantoprazole (PROTONIX) 40 MG tablet Take 40 mg by mouth 2 (two) times daily.    . vitamin B-12 (CYANOCOBALAMIN) 1000 MCG tablet Take 1,000 mcg by mouth daily.    . feeding supplement, GLUCERNA SHAKE, (GLUCERNA SHAKE) LIQD Take 237 mLs by mouth 3 (three) times daily between meals. (Patient not taking: Reported on 09/15/2015) 30 Can 0    LABS/IMAGING: Results for orders placed or  performed during the hospital encounter of 09/15/15 (from the past 48 hour(s))  CBC with Differential     Status: Abnormal   Collection Time: 09/15/15 12:09 PM  Result Value Ref Range   WBC 5.8 4.0 - 10.5 K/uL   RBC 4.14 (L) 4.22 - 5.81 MIL/uL   Hemoglobin 12.6 (L) 13.0 - 17.0 g/dL   HCT 38.3 (L) 39.0 - 52.0 %   MCV 92.5 78.0 - 100.0 fL   MCH 30.4 26.0 - 34.0 pg   MCHC 32.9 30.0 - 36.0 g/dL   RDW 14.1 11.5 - 15.5 %   Platelets  150 - 400 K/uL    PLATELET CLUMPS NOTED ON SMEAR, COUNT APPEARS DECREASED   Neutrophils Relative % 59 %   Neutro Abs 3.4 1.7 - 7.7 K/uL   Lymphocytes Relative 31 %   Lymphs Abs 1.8 0.7 - 4.0 K/uL   Monocytes Relative 9 %   Monocytes Absolute 0.5 0.1 - 1.0 K/uL   Eosinophils Relative 1 %   Eosinophils Absolute 0.1 0.0 - 0.7 K/uL   Basophils Relative 0 %   Basophils Absolute 0.0 0.0 - 0.1 K/uL  Comprehensive metabolic panel     Status: Abnormal   Collection Time: 09/15/15 12:09 PM  Result Value Ref Range   Sodium 136 135 - 145 mmol/L   Potassium 4.6 3.5 - 5.1 mmol/L   Chloride 112 (H) 101 - 111 mmol/L   CO2 18 (L) 22 - 32 mmol/L   Glucose, Bld 453 (H) 65 - 99 mg/dL   BUN 21 (H) 6 - 20 mg/dL   Creatinine, Ser 1.51 (H) 0.61 - 1.24 mg/dL   Calcium 9.1 8.9 - 10.3 mg/dL   Total Protein 7.4 6.5 - 8.1 g/dL   Albumin 2.4 (L) 3.5 - 5.0 g/dL   AST 226 (H) 15 - 41 U/L   ALT 171 (H) 17 - 63 U/L   Alkaline Phosphatase 170 (H) 38 - 126 U/L   Total Bilirubin 0.9 0.3 - 1.2 mg/dL   GFR calc non Af Amer 51 (L) >60 mL/min   GFR calc Af Amer 59 (L) >60 mL/min    Comment: (NOTE) The eGFR has been calculated using the CKD EPI equation. This calculation has not been validated in all clinical situations. eGFR's persistently <60 mL/min signify possible Chronic Kidney Disease.    Anion gap 6 5 - 15  Ammonia     Status: Abnormal   Collection Time: 09/15/15 12:09 PM  Result Value Ref Range   Ammonia 108 (H) 9 - 35 umol/L  Urinalysis, Routine w reflex microscopic      Status: Abnormal   Collection Time: 09/15/15 12:24 PM  Result Value Ref Range   Color, Urine YELLOW YELLOW   APPearance CLEAR CLEAR   Specific Gravity, Urine 1.024 1.005 - 1.030   pH 6.5 5.0 - 8.0   Glucose, UA >1000 (A) NEGATIVE mg/dL  Hgb urine dipstick TRACE (A) NEGATIVE   Bilirubin Urine NEGATIVE NEGATIVE   Ketones, ur NEGATIVE NEGATIVE mg/dL   Protein, ur 100 (A) NEGATIVE mg/dL   Nitrite NEGATIVE NEGATIVE   Leukocytes, UA NEGATIVE NEGATIVE  Urine microscopic-add on     Status: Abnormal   Collection Time: 09/15/15 12:24 PM  Result Value Ref Range   Squamous Epithelial / LPF 0-5 (A) NONE SEEN   WBC, UA 0-5 0 - 5 WBC/hpf   RBC / HPF 0-5 0 - 5 RBC/hpf   Bacteria, UA RARE (A) NONE SEEN  CBG monitoring, ED     Status: Abnormal   Collection Time: 09/15/15  1:39 PM  Result Value Ref Range   Glucose-Capillary 476 (H) 65 - 99 mg/dL   No results found.  VITALS: Vitals:   09/15/15 1415 09/15/15 1445  BP: 142/94 150/96  Pulse: 69 79  Resp: 11 18  Temp:      EXAM: General appearance: is intermittently alert cooperative during examination, in no acute distress Neck: no adenopathy, no carotid bruit, no JVD, supple, symmetrical, trachea midline and thyroid not enlarged, symmetric, no tenderness/mass/nodules Lungs: clear to auscultation bilaterally and normal work of breathing Heart: S1, S2 normal and systolic murmur: systolic ejection 2/6, decrescendo at the left sternal border Abdomen: soft, non-tender; bowel sounds normal; no masses,  no organomegaly Extremities: extremities normal, atraumatic, no cyanosis or edema Skin: Skin color, texture, turgor normal. No rashes or lesions Neurologic: Mental status: alertness: intermittently alert, orientation: person, president Cranial nerves: II: pupils equal, round, reactive to light and accommodation, III,IV,VI: extraocular muscles extra-ocular motions intact, VII: upper facial muscle function normal bilaterally, VII: lower facial muscle  function normal bilaterally, VIII: hearing normal, IX: soft palate elevation normal bilaterally, XI: trapezius strength normal bilaterally, XII: tongue strength normal  Sensory: normal Motor: 5/5 upper arm extension and flexion bilaterally, 5/5 forearm extension and flexion bilaterally, 5/5 grip strength bilaterally  Asterixis noted.   ASSESSMENT/PLAN: [name] is a 55 y.o. male with a past medical history significant for chronic hepatitis B and type 2 DM who presents with worsening confusion over the past 3 days, which is his 3rd episode of confusion in the past month, likely attributable to hepatic encephalopathy in the setting of recent decrease in daily lactulose dose.    Acute encephalopathy: Patient presents with transaminitis, AST 226, ALT 171, and alk phos of 170. Patient with reported history of chronic hepatitis infection, Hep B DNA PCR > 1,000,000 on 08/08/15. Patient has been admitted for 2 other episodes of confusion in the last month which were improved with lactulose and rifaxminin, but patient has not been able to afford rifaximin as an outpatient. Symptoms started shortly after lactulose decreased from 30 mg t.i.d. to 30 mg bid. Patient was afebrile on presentation with BP of 153/93 and HR 76. UA showed glucosuria, trace Hgb, and proteinuria. Metabolic labs on presentation were notable for normal anion gap, bicarb 18, BUN 21, Cr 1.51 (baseline 1.3) glucose of 453, and ammonia 108. Asterixis was elicited on physical examination. - Start lactulose 30 gm t.i.d. And titrate to 3-4 bowel movements per day. Plan to manage encephalopathy without rifaximin due to outpatient unavailability.  Hyperglycemia: patient with history of type 2 diabetes mellitus, home medications are metformin and glipizide. Presented with glucose of 453 and urine glucose >1000, bicarb of 18 with normal anion gap. The patient received 1L NS bolus and 6 units of insulin aspart in the ED.   - Sliding scale insulin -  Start  glipizide   Hypertension: patient presented with BP 153/93 and HR 76, last reading BP 134/89 and HR 69. Patient recently increased to 10 mg amlodipine from 5 mg. - Continue amlodipine 10 mg daily  Chronic hepatitis B virus: AST 226, ALT 171, and alk phos of 170. , Hep B DNA PCR > 1,000,000 on 08/08/15. U/S on 8/9 showed coarse echotexture of the liver with somewhat lobulated liver contour, indicative of a degree of underlying parenchymal disease, likely a degree of cirrhosis. - plan f/u with [facility] and outpatient referral to ID  4. DVT/PE prophylaxis - Lovenox 40 mg subcutaneous injection once daily  Disposition: admit to Observation. Expected length of stay less than 2 midnights.  Signed: [place] [facility]

## 2015-09-15 NOTE — ED Notes (Signed)
Pt AO to self only according to day shift nurse, pt here for encephalopathy, bed changed to Telemetry monitor, pt having Ammonia level of 108, pt to be transfer to unit at 1920 per day shift nurse.

## 2015-09-15 NOTE — Assessment & Plan Note (Signed)
Assessment:  Hepatic encephalopathy Patient has not had changes in mental status since discharge Patient is having 4-5 bowel movements daily   Plan - Refill lactulose - may take lactulose 2 times daily instead of 3 as long as still having 3 bowel movements daily.

## 2015-09-16 DIAGNOSIS — E1165 Type 2 diabetes mellitus with hyperglycemia: Secondary | ICD-10-CM

## 2015-09-16 DIAGNOSIS — K7290 Hepatic failure, unspecified without coma: Principal | ICD-10-CM

## 2015-09-16 DIAGNOSIS — B181 Chronic viral hepatitis B without delta-agent: Secondary | ICD-10-CM

## 2015-09-16 DIAGNOSIS — Z794 Long term (current) use of insulin: Secondary | ICD-10-CM

## 2015-09-16 LAB — SEDIMENTATION RATE: SED RATE: 93 mm/h — AB (ref 0–16)

## 2015-09-16 LAB — GLUCOSE, CAPILLARY
GLUCOSE-CAPILLARY: 246 mg/dL — AB (ref 65–99)
GLUCOSE-CAPILLARY: 85 mg/dL (ref 65–99)
GLUCOSE-CAPILLARY: 223 mg/dL — AB (ref 65–99)
Glucose-Capillary: 332 mg/dL — ABNORMAL HIGH (ref 65–99)
Glucose-Capillary: 468 mg/dL — ABNORMAL HIGH (ref 65–99)

## 2015-09-16 LAB — RAPID URINE DRUG SCREEN, HOSP PERFORMED
Amphetamines: NOT DETECTED
BARBITURATES: NOT DETECTED
Benzodiazepines: NOT DETECTED
Cocaine: NOT DETECTED
Opiates: NOT DETECTED
Tetrahydrocannabinol: NOT DETECTED

## 2015-09-16 LAB — COMPREHENSIVE METABOLIC PANEL
ALBUMIN: 2.2 g/dL — AB (ref 3.5–5.0)
ALT: 154 U/L — ABNORMAL HIGH (ref 17–63)
ANION GAP: 7 (ref 5–15)
AST: 202 U/L — ABNORMAL HIGH (ref 15–41)
Alkaline Phosphatase: 158 U/L — ABNORMAL HIGH (ref 38–126)
BILIRUBIN TOTAL: 0.9 mg/dL (ref 0.3–1.2)
BUN: 15 mg/dL (ref 6–20)
CO2: 22 mmol/L (ref 22–32)
Calcium: 9.1 mg/dL (ref 8.9–10.3)
Chloride: 110 mmol/L (ref 101–111)
Creatinine, Ser: 1.34 mg/dL — ABNORMAL HIGH (ref 0.61–1.24)
GFR calc Af Amer: >60 mL/min (ref >60)
GFR, EST NON AFRICAN AMERICAN: 59 mL/min — AB (ref >60)
GLUCOSE: 281 mg/dL — AB (ref 65–99)
POTASSIUM: 4 mmol/L (ref 3.5–5.1)
Sodium: 139 mmol/L (ref 135–145)
TOTAL PROTEIN: 6.6 g/dL (ref 6.5–8.1)

## 2015-09-16 LAB — CBC
HEMATOCRIT: 38.5 % — AB (ref 39.0–52.0)
HEMOGLOBIN: 12.6 g/dL — AB (ref 13.0–17.0)
MCH: 29.8 pg (ref 26.0–34.0)
MCHC: 32.7 g/dL (ref 30.0–36.0)
MCV: 91 fL (ref 78.0–100.0)
Platelets: UNDETERMINED 10*3/uL (ref 150–400)
RBC: 4.23 MIL/uL (ref 4.22–5.81)
RDW: 13.8 % (ref 11.5–15.5)
WBC: 4.9 10*3/uL (ref 4.0–10.5)

## 2015-09-16 LAB — GLUCOSE, RANDOM: GLUCOSE: 451 mg/dL — AB (ref 65–99)

## 2015-09-16 LAB — C-REACTIVE PROTEIN: CRP: 1.2 mg/dL — ABNORMAL HIGH (ref <1.0)

## 2015-09-16 MED ORDER — IBUPROFEN 200 MG PO TABS
600.0000 mg | ORAL_TABLET | Freq: Four times a day (QID) | ORAL | Status: AC | PRN
  Administered 2015-09-16 – 2015-09-17 (×2): 600 mg via ORAL
  Filled 2015-09-16 (×2): qty 3

## 2015-09-16 NOTE — Progress Notes (Signed)
Pt's CBG 468 notified attending I was asked to put in stat lab order and to give pt 9 units of novo log.

## 2015-09-16 NOTE — Progress Notes (Signed)
SUBJECTIVE: Notable improvement in alertness and cooperativity this morning compared to presentation. No nausea or vomiting, shortness of breath, or pain overnight, except for some pain in R hip posteriorly. Had one "good" bowel movement overnight. Reports having lived n [place] recently moved to [place] to receive care, Saw Infectious Diseases in [place] in for "lower back infection", unclear as to what medication he received.   BP 137/79 (BP Location: Right Arm)   Pulse 64   Temp 98.7 F (37.1 C) (Oral)   Resp 20   Wt 73.7 kg (162 lb 6.4 oz)   SpO2 98%   BMI 23.98 kg/m   Intake/Output Summary (Last 24 hours) at 09/16/15 1039 Last data filed at 09/16/15 0631  Gross per 24 hour  Intake             1000 ml  Output             2327 ml  Net            -1327 ml    PHYSICAL EXAM General appearance: alert, cooperative and no distress Lungs: clear to auscultation bilaterally Heart: regular rate and rhythm and S1, S2 normal Abdomen: soft, non-tender to palpation, dull to percussion, normoactive bowel sounds, non-distended Extremities: extremities normal, atraumatic, no cyanosis or edema Skin: Skin color, texture, turgor normal. No rashes or lesions Neuro: alert and oriented to person, place, not day of the week. No asterixis.   LABS/IMAGING: Comprehensive metabolic panel     Status: Abnormal   Collection Time: 09/16/15  5:22 AM  Result Value Ref Range   Sodium 139 135 - 145 mmol/L   Potassium 4.0 3.5 - 5.1 mmol/L   Chloride 110 101 - 111 mmol/L   CO2 22 22 - 32 mmol/L   Glucose, Bld 281 (H) 65 - 99 mg/dL   BUN 15 6 - 20 mg/dL   Creatinine, Ser 1.34 (H) 0.61 - 1.24 mg/dL   Calcium 9.1 8.9 - 10.3 mg/dL   Total Protein 6.6 6.5 - 8.1 g/dL   Albumin 2.2 (L) 3.5 - 5.0 g/dL   AST 202 (H) 15 - 41 U/L   ALT 154 (H) 17 - 63 U/L   Alkaline Phosphatase 158 (H) 38 - 126 U/L   Total Bilirubin 0.9 0.3 - 1.2 mg/dL   GFR calc non Af Amer 59 (L) >60 mL/min   GFR calc Af Amer  >60 >60 mL/min    Comment: (NOTE) The eGFR has been calculated using the CKD EPI equation. This calculation has not been validated in all clinical situations. eGFR's persistently <60 mL/min signify possible Chronic Kidney Disease.    Anion gap 7 5 - 15  CBC     Status: Abnormal   Collection Time: 09/16/15  5:22 AM  Result Value Ref Range   WBC 4.9 4.0 - 10.5 K/uL   RBC 4.23 4.22 - 5.81 MIL/uL   Hemoglobin 12.6 (L) 13.0 - 17.0 g/dL   HCT 38.5 (L) 39.0 - 52.0 %   MCV 91.0 78.0 - 100.0 fL   MCH 29.8 26.0 - 34.0 pg   MCHC 32.7 30.0 - 36.0 g/dL   RDW 13.8 11.5 - 15.5 %   Platelets PLATELET CLUMPS NOTED ON SMEAR, UNABLE TO ESTIMATE 150 - 400 K/uL    Comment: PLATELET CLUMPS NOTED ON SMEAR, COUNT APPEARS DECREASED  Glucose, capillary     Status: Abnormal   Collection Time: 09/16/15  6:19 AM  Result Value Ref Range   Glucose-Capillary 246 (H) 65 -  99 mg/dL   Comment 1 Notify RN    Comment 2 Document in Chart   C-reactive protein     Status: Abnormal   Collection Time: 09/16/15  9:10 AM  Result Value Ref Range   CRP 1.2 (H) <1.0 mg/dL    Current Meds: . amLODipine  10 mg Oral Daily  . enoxaparin (LOVENOX) injection  40 mg Subcutaneous Q24H  . folic acid  1 mg Oral Daily  . gabapentin  300 mg Oral TID  . glipiZIDE  5 mg Oral BID AC  . insulin aspart  0-9 Units Subcutaneous TID WC  . lactulose  30 g Oral TID  . pantoprazole  40 mg Oral BID  . vitamin B-12  1,000 mcg Oral Daily     ASSESSMENT AND PLAN: Henry Solomon is a 55 y.o. male with a past medical history significant for chronic hepatitis B,  type 2 diabetes and HTN, who presents with worsening confusion over the past 3 days, which is his 3rd episode of confusion since 07/2015, possibly attributable to hepatic encephalopathy in the setting of recent decrease in daily lactulose dose. However, patient's confusion lability and the limited degree of hepatic dysfunction raise concern for another etiology for the patient's  condition.  Active Problems:   Diabetes mellitus without complication ([place])   Hepatic encephalopathy ([place])   Chronic hepatitis B ([place])   Cirrhosis of liver without ascites ([place])  Acute encephalopathy: Patient presents with third episode of confusion since 07/2015, has been controlled with lactulose 30 gm t.i.d., but has shown to have rapid onset of confusion when on lower/missing dose of lactulose. Patient presented with transaminitis, AST 226, ALT 171, and alk phos of 170; latest labs show AST 202, ALT 154, and alk phos 158. Patient with reported  2-year history of chronic hepatitis infection which he endorses being treated for. Hep B DNA PCR > 1,000,000 on 08/08/15. Patient transaminitis has worsened since presentation in 08/05/2015, when he had AST 89, ALT 84. Patient has remained afebrile and vitals have been stable since presentation. Patient could have possible drug exposure, infection or viral meningitis. UA notable for glucosuria, trace Hgb, and proteinuria. Metabolic labs on presentation were notable for normal anion gap, bicarb 18, BUN 21, Cr 1.51 (baseline 1.3) glucose of 453, and ammonia 108.  - Start lactulose 30 gm t.i.d. And titrate to 3-4 bowel movements per day. Plan to manage encephalopathy without rifaximin due to outpatient unavailability. - Obtain UDS - Obtain RPR - Obtain ESR and CRP  Hyperglycemia: patient with history of type 2 diabetes mellitus, home medications are metformin and glipizide. Presented with glucose of 453 and urine glucose >1000, bicarb of 18 with normal anion gap. Latest glucose of 281, bicarb 22. - Sliding scale insulin - Continue glipizide  - Carbohydrate modified diet - Obtain HbA1c  Lower back pain: Patient endorses mild low back pain. Discharged 07/21/15 from [facility] after presenting with suspected lumbar osteomyelitis, but had negative biopsy. Patient pine CT on 08/05/15 showed possible spinal osteomyelitis, MRI thoracic spine showed no  thoracic osteomyelitis, and lumbar MRI was unable to be obtained during that admission. Patient remains afebrile, WBC 4.9. - Consider lumbar MRI  Hypertension: patient presented with BP 153/93 and HR 76, last reading BP 134/89 and HR 69. Patient recently increased to 10 mg amlodipine from 5 mg. - Continue amlodipine 10 mg daily  Chronic hepatitis B virus: AST 226, ALT 171, and alk phos of 170. , Hep B DNA PCR > 1,000,000  on 08/08/15. U/S on 8/9 showed coarse echotexture of the liver with somewhat lobulated liver contour, indicative of a degree of underlying parenchymal disease, likely a degree of cirrhosis. - plan f/u with [facility] and outpatient referral to ID  4. DVT/PE prophylaxis - Lovenox 40 mg subcutaneous injection once daily  Disposition: admitted as Inpatient to Internal Medicine Teaching Service. Expected length of stay less than 2 midnights.  Will Henry Rothschild MS-4 [facility] 8/10/201710:39 AM

## 2015-09-16 NOTE — Progress Notes (Signed)
   Subjective: No acute events overnight. Patient is more alert and oriented this morning as compared at time of presentation. He endorses one large bowel movement overnight. He continues to complain of right-sided hip pain. He has no additional acute complaints or concerns this morning.  Objective:  Vital signs in last 24 hours: Vitals:   09/15/15 2008 09/16/15 0125 09/16/15 0625 09/16/15 1003  BP: (!) 157/94 (!) 142/79 129/80 137/79  Pulse: 69 66 67 64  Resp: '16 18 18 20  '$ Temp: 98.3 F (36.8 C) 98.4 F (36.9 C) 98.5 F (36.9 C) 98.7 F (37.1 C)  TempSrc: Oral Oral Oral Oral  SpO2: 100% 100% 100% 98%  Weight: 162 lb 6.4 oz (73.7 kg)      Physical Exam  Constitutional: He appears well-developed and well-nourished.  HENT:  Head: Normocephalic and atraumatic.  Cardiovascular: Normal rate and regular rhythm.  Exam reveals no friction rub.   No murmur heard. Respiratory: Effort normal and breath sounds normal. No respiratory distress. He has no wheezes.  GI: Soft. Bowel sounds are normal. He exhibits no distension. There is no tenderness.  No abdominal bruits auscultated  Musculoskeletal: He exhibits no edema.  Pain upon palpation of the patient's right greater trochanter.  Neurological: He is alert.  Oriented to person, place not oriented to day of the week. He is oriented to president. No asterixis is noted on examination this morning.     Assessment/Plan:  Active Problems:   Diabetes mellitus without complication ([facility])   Hepatic encephalopathy ([facility])   Chronic hepatitis B ([facility])   Cirrhosis of liver without ascites ([facility])  Assessment & Plan by Problem: [name] is a 55 year old male with a past medical history of chronic hepatitis B, cirrhosis, type 2 diabetes mellitus and hepatic encephalopathy who presents with a 5 day history of confusion and altered mental status likely secondary to hepatic encephalopathy in the setting of decreased lactulose dosage.  1. AMS 2/2  hepatic encephalopathy -- Patient presenting with worsening altered mental status following reduction in lactulose dose. Also not currently taking prescribed rifaximin. Altered mental status is most likely not 2/2 stroke given the patients normal neurological examination. Additionally, the patient's altered mental status may be secondary to drug or toxin exposure. -- Ammonia level 108, AST 202, ALT 154 and alkaline phosphatase 158 -- Increase lactulose to 30 g 3 times a day and titrate to 3-4 bowel movements per day -- UDS -- RPR -- ESR -- CRP  2. Hyperglycemia -- Patient type II diabetic -- Most recent glucose 246 -- Sliding scale insulin -- Continue glipizide 5 mg twice a day -- Carbohydrate modified diet -- Hemoglobin A1c  3. Chronic hepatitis B virus infection -- Follow-up with [facility] -- We'll need referral to infectious diseases in [place] as patient has recently moved from [place] and currently lives with his mother.   4. DVT/PE prophylaxis -- Lovenox 40 mg subcutaneous injection once daily  Dispo: Anticipated discharge in approximately 1-2 day(s).   Ophelia Shoulder, MD 09/16/2015, 11:24 AM Pager: [phone]

## 2015-09-16 NOTE — Progress Notes (Signed)
Inpatient Diabetes Program Recommendations  AACE/ADA: New Consensus Statement on Inpatient Glycemic Control (2015)  Target Ranges:  Prepandial:   less than 140 mg/dL      Peak postprandial:   less than 180 mg/dL (1-2 hours)      Critically ill patients:  140 - 180 mg/dL   Results for Henry Solomon, [name] (MRN [id number]) as of 09/16/2015 15:22  Ref. Range 09/15/2015 13:39 09/15/2015 18:40 09/16/2015 00:11 09/16/2015 06:19 09/16/2015 11:37  Glucose-Capillary Latest Ref Range: 65 - 99 mg/dL 409476 (H) 811222 (H) 914468 (H) 246 (H) 332 (H)    Review of Glycemic Control  Diabetes history: DM2 Outpatient Diabetes medications: Glucotrol 5 mg bid + Metformin 500 qd Current orders for Inpatient glycemic control: Glucotrol 5 mg bid +Novolog correction 0-9 units tid  Inpatient Diabetes Program Recommendations:  Noted elevated CBGs. Please consider Lantus 15 units daily + increase Novolog correction to 0-15 units tid+0-5 units hs.  Thank you, Billy FischerJudy E. Teaghan Melrose, RN, MSN, CDE Inpatient Glycemic Control Team Team Pager [phone] (8am-5pm) 09/16/2015 3:26 PM

## 2015-09-17 ENCOUNTER — Inpatient Hospital Stay (HOSPITAL_COMMUNITY): Payer: Medicaid Other

## 2015-09-17 LAB — GLUCOSE, CAPILLARY
GLUCOSE-CAPILLARY: 472 mg/dL — AB (ref 65–99)
Glucose-Capillary: 211 mg/dL — ABNORMAL HIGH (ref 65–99)
Glucose-Capillary: 499 mg/dL — ABNORMAL HIGH (ref 65–99)
Glucose-Capillary: 339 mg/dL — ABNORMAL HIGH (ref 65–99)

## 2015-09-17 LAB — RPR: RPR Ser Ql: NONREACTIVE

## 2015-09-17 LAB — HEMOGLOBIN A1C
HEMOGLOBIN A1C: 11.3 % — AB (ref 4.8–5.6)
Mean Plasma Glucose: 278 mg/dL

## 2015-09-17 MED ORDER — LACTULOSE 10 GM/15ML PO SOLN: 30.0000 g | Freq: Three times a day (TID) | ORAL | 11 refills | Status: AC

## 2015-09-17 MED ORDER — INSULIN ASPART 100 UNIT/ML ~~LOC~~ SOLN: 0.0000 [IU] | Freq: Every day | SUBCUTANEOUS | Status: DC

## 2015-09-17 MED ORDER — METFORMIN HCL 500 MG PO TABS: 1000.0000 mg | ORAL_TABLET | Freq: Two times a day (BID) | ORAL | 0 refills | Status: DC

## 2015-09-17 MED ORDER — LACTULOSE 10 GM/15ML PO SOLN: 30.0000 g | Freq: Three times a day (TID) | ORAL | 11 refills | Status: DC

## 2015-09-17 MED ORDER — INSULIN ASPART 100 UNIT/ML ~~LOC~~ SOLN
0.0000 [IU] | Freq: Three times a day (TID) | SUBCUTANEOUS | Status: DC
  Administered 2015-09-17: 11 [IU] via SUBCUTANEOUS
  Administered 2015-09-17: 15 [IU] via SUBCUTANEOUS

## 2015-09-17 NOTE — Care Management Note (Signed)
Case Management Note  Patient Details  Name: Henry Solomon MRN: [id number] Date of Birth: 12/23/1960  Subjective/Objective:  Pt in with hepatic encephalopathy. He is from home with his mom. Pt without insurance but is seen in the Internal Medicine Clinic.                   Action/Plan: Jasmin in financial counseling is following. Continued medical workup. CM following for discharge needs.   Expected Discharge Date:   (Pending)               Expected Discharge Plan:  Home/Self Care  In-House Referral:     Discharge planning Services     Post Acute Care Choice:    Choice offered to:     DME Arranged:    DME Agency:     HH Arranged:    HH Agency:     Status of Service:  In process, will continue to follow  If discussed at Long Length of Stay Meetings, dates discussed:    Additional Comments:  Kermit BaloKelli F Baylor Teegarden, RN 09/17/2015, 1:34 PM

## 2015-09-17 NOTE — Progress Notes (Signed)
Dr. Antionette PolesSchreiner notified of patient's recheck CBG 499. Per MD, recheck blood sugar in 4 hours. Will continue to monitor patient.

## 2015-09-17 NOTE — Progress Notes (Addendum)
CBG 472. MD notified at 1143AM. MD to put order in for coverage MD notified RN to give patient full sliding scale dosage (15 units). Readback. Administered  To patient. Will recheck CBG in 20 minutes. NT notified

## 2015-09-17 NOTE — Progress Notes (Signed)
RN discussed discharge instructions with patient and patient's mother. Patient's mother upset, yelling at nursing staff due to quantity of prescriptions. MD corrected, spoke with patient's mother. Patient's family upset that prescriptions were not sent over to pharmacy, RN offered to have them sent, patient's family declined, stating, "y'all gotta learn how to listen." Patient and his mother assisted to bus stop by DurandHeather, NT.

## 2015-09-17 NOTE — Progress Notes (Signed)
SUBJECTIVE: patient reports feeling better. He is markedly more attentive and cooperative compared to presentation. He continues to complain of R hip pain and mild low R back pain. Denies nausea, vomiting, hematochezia, hematuria, dysuria. Had 3 bowel movements yesterday and 3 bowel movements already today. Denies shortness of breath, chest pain, dizziness, light-headedness upon standing. He reports having no difficulty walking around.   BP 138/87 (BP Location: Right Arm)   Pulse 71   Temp 98.1 F (36.7 C) (Oral)   Resp 20   Wt 73.7 kg (162 lb 6.4 oz)   SpO2 100%   BMI 23.98 kg/m   Intake/Output Summary (Last 24 hours) at 09/17/15 1135 Last data filed at 09/17/15 0953  Gross per 24 hour  Intake                0 ml  Output             2626 ml  Net            -2626 ml    PHYSICAL EXAM General appearance: alert, cooperative and no distress Lungs: clear to auscultation bilaterally and normal work of breathing Heart: regular rate and rhythm, S1, S2 normal, no murmur, click, rub or gallop Abdomen: soft, non-tender; bowel sounds normal; no masses,  no organomegaly and dullness to percussion in all quadrants Extremities: extremities normal, atraumatic, no cyanosis or edema Skin: Skin color, texture, turgor normal. No rashes or lesions Neuro: alert and oriented x3   LABS/IMAGING: C-reactive protein     Status: Abnormal   Collection Time: 09/16/15  9:10 AM  Result Value Ref Range   CRP 1.2 (H) <1.0 mg/dL  Urine rapid drug screen (hosp performed)     Status: None   Collection Time: 09/16/15 11:18 AM  Result Value Ref Range   Opiates NONE DETECTED NONE DETECTED   Cocaine NONE DETECTED NONE DETECTED   Benzodiazepines NONE DETECTED NONE DETECTED   Amphetamines NONE DETECTED NONE DETECTED   Tetrahydrocannabinol NONE DETECTED NONE DETECTED   Barbiturates NONE DETECTED NONE DETECTED  Sedimentation rate     Status: Abnormal   Collection Time: 09/16/15 12:28 PM  Result Value Ref Range   Sed Rate 93 (H) 0 - 16 mm/hr  RPR     Status: None   Collection Time: 09/16/15 12:28 PM  Result Value Ref Range   RPR Ser Ql Non Reactive Non Reactive    Comment: (NOTE) Performed At: [facility] [place], [place] [id number] Lindon Romp MD DJ:[id number]    Current Meds: . amLODipine  10 mg Oral Daily  . enoxaparin (LOVENOX) injection  40 mg Subcutaneous Q24H  . folic acid  1 mg Oral Daily  . gabapentin  300 mg Oral TID  . glipiZIDE  5 mg Oral BID AC  . insulin aspart  0-15 Units Subcutaneous TID WC  . insulin aspart  0-5 Units Subcutaneous QHS  . lactulose  30 g Oral TID  . pantoprazole  40 mg Oral BID  . vitamin B-12  1,000 mcg Oral Daily    ASSESSMENT AND PLAN: Mr. Rufener is a 55 y.o. malewith a past medical history significant for chronic hepatitis B,  type 2 diabetes and HTN, who presents with worsening confusion over the past 3 days, which is his 3rd episode of confusion since 07/2015, possibly attributable to hepatic encephalopathy in the setting of recent decrease in daily lactulose dose. However, patient's confusion lability and the limited degree of hepatic dysfunction raise concern for  another etiology for the patient's condition.  Active Problems:   Diabetes mellitus without complication ([place])   Hepatic encephalopathy (HCC)   Chronic hepatitis B ([place])   Cirrhosis of liver without ascites ([place])  Acute encephalopathy:This is the patient's 3rd episode of confusion since 07/2015, has been controlled with lactulose 30 gm t.i.d., but has shown to have rapid onset of confusion when on lower/missing dose of lactulose. Patient presented with transaminitis, AST 226, ALT 171, and alk phos of 170; latest labs show AST 202, ALT 154, and alk phos 158. Patient with reported  2-year history of chronic hepatitis infection which he endorses being treated for, Hep B DNA PCR >1,000,000 on 08/08/15. He also reports being diagnosed with Hep C in 2007. Patient  transaminitis has worsened since presentation in 08/05/2015, when he had AST 89, ALT 84. Patient has remained afebrile and vitals have been stable since presentation. Drug intoxication, infectious, intracranial etiologies have been investigated and are unlikely. UA notable for glucosuria, trace Hgb, and proteinuria. Metabolic labs on presentation were notable for normal anion gap, bicarb 18, BUN 21, Cr 1.51 (baseline 1.3) glucose of 453, and ammonia 108. Possible osteomyelitis was suspected on prior visit, but MRI lumbar spine was not pursued. - Continue lactulose 30 gm t.i.d. And titrate to 3-4 bowel movements per day. Plan to manage encephalopathy without rifaximin due to outpatient unavailability. - MRI lumbar spine w/o contrast today - obtain HepA total ab  Hyperglycemia: patient with history of type 2 diabetes mellitus, home medications are metformin and glipizide, HbA1c 11.3. Presented with glucose of 453 and urine glucose >1000, bicarb of 18 with normal anion gap. Glucose measurements have consistently been above above 200. There are concerns about patient being able to manage Novolog at home, metformin can be used with the patient's hepatic function. Considering titrating outpatient metformin to 1000 mg in morning and 500 mg in evening and continuing home glipizide. Plan to work with patient and diabetes coordinator for outpatient diabetes management.  - Sliding scale insulin - Continue glipizide  - Carbohydrate modified diet - Coordinate discharge diabetes management - f/u in [facility] clinic  Lower back pain: Patient endorses mild low back pain. Discharged 07/21/15 from [facility] after presenting with suspected lumbar osteomyelitis, but had negative biopsy. Patient spine CT on 08/05/15 showed possible spinal osteomyelitis, MRI thoracic spine showed no thoracic osteomyelitis, and lumbar MRI was unable to be obtained during that admission. Patient remains afebrile, WBC 4.9. - MRI lumbar  spine w/o contrast as above  Hypertension: patient presented with BP 153/93 and HR 76, last reading BP 134/89 and HR 69. Patient recently increased to 10 mg amlodipine from 5 mg. - Continue amlodipine 10 mg daily  Chronic hepatitis B virus: AST 226, ALT 171, and alk phos of 170., Hep B DNA PCR > 1,000,000 on 08/08/15.U/S on 8/9 showed coarse echotexture of the liver with somewhat lobulated liver contour,indicative of a degree of underlying parenchymal disease, likely a degree of cirrhosis. - plan f/u with [facility] and outpatient referral to ID  4. DVT/PE prophylaxis - Lovenox 40 mg subcutaneous injection once daily  Disposition: admitted as Inpatient to Internal Medicine Teaching Service. Expected length of stay less than 2 midnights.  Will Johnte Portnoy MS4 [facility] 8/11/201711:35 AM

## 2015-09-17 NOTE — Discharge Summary (Signed)
Name: Henry Solomon MRN: [id number] DOB: 1960/08/07 55 y.o. PCP: Marliss Coots, NP  Date of Admission: 09/15/2015 11:28 AM Date of Discharge: 09/17/2015 Attending Physician: Axel Filler, MD  Discharge Diagnosis: 1. Hepatic Encephalopathy  Active Problems:   Diabetes mellitus without complication (HCC)   Hepatic encephalopathy (HCC)   Chronic hepatitis B ([place])   Cirrhosis of liver without ascites ([place])   Discharge Medications:   Medication List    TAKE these medications   amLODipine 10 MG tablet Commonly known as:  NORVASC Take 1 tablet (10 mg total) by mouth daily.   CAL-MAG-ZINC PO Take 1 tablet by mouth daily.   cholecalciferol 1000 units tablet Commonly known as:  VITAMIN D Take 1,000 Units by mouth daily.   feeding supplement (GLUCERNA SHAKE) Liqd Take 237 mLs by mouth 3 (three) times daily between meals.   FLAX SEEDS PO Take 1 capsule by mouth daily.   folic acid 1 MG tablet Commonly known as:  FOLVITE Take 1 tablet (1 mg total) by mouth daily.   gabapentin 300 MG capsule Commonly known as:  NEURONTIN Take 1 capsule (300 mg total) by mouth 3 (three) times daily. What changed:  when to take this   glipiZIDE 5 MG tablet Commonly known as:  GLUCOTROL Take 1 tablet (5 mg total) by mouth 2 (two) times daily before a meal. What changed:  when to take this   glucose blood test strip Use as instructed   lactulose 10 GM/15ML solution Commonly known as:  CHRONULAC Take 45 mLs (30 g total) by mouth 3 (three) times daily. What changed:  additional instructions   metFORMIN 500 MG tablet Commonly known as:  GLUCOPHAGE Take 2 tablets (1,000 mg total) by mouth 2 (two) times daily with a meal. Take as instructed What changed:  how much to take  when to take this  additional instructions   pantoprazole 40 MG tablet Commonly known as:  PROTONIX Take 40 mg by mouth 2 (two) times daily.   vitamin B-12 1000 MCG tablet Commonly known as:   CYANOCOBALAMIN Take 1,000 mcg by mouth daily.       Disposition and follow-up:   Henry Solomon was discharged from [facility] in Good condition.  At the hospital follow up visit please address:  1.  Medication adherence. Additionally, please evaluate the patients diabetes regimen. We have decided not to start insulin for the patient based on concerns that he may not be able to take this medication in the out patient setting.  2.  Labs / imaging needed at time of follow-up: Will  need an X-ray of his right hip to evaluate for pain.  3.  Pending labs/ test needing follow-up: FTA-ABS  Follow-up Appointments: Follow-up Information    [facility] ACC. Go in 4 day(s).   Why:  Please make sure you go to your appointment in the [facility] Internal Medicine [facility]) [facility] for hospital follow-up on Tuesday at 10:15 Contact information: [place] Hospital Course by problem list: Active Problems:   Diabetes mellitus without complication ([place])   Hepatic encephalopathy ([place])   Chronic hepatitis B ([place])   Cirrhosis of liver without ascites ([place])   1. Hepatic encephalopathy The patient presented to the [facility] emergency department on 09/15/2015 with a 5 day history of altered mental status. The patient had a clinic appointment on 08/10/2015 at which time  his lactulose dose was decreased from three times daily to twice daily. Additionally, he was prescribed rifaximin but was not taking this as cost was prohibitive. Following the change in his lactulose regimen the patient began to progressively become more confused over the course of a week. At the time of presentation to the emergency room he was not oriented to place or time. His initial labs were concerning for an AST of 226, ALT of 171 and ammonia of 108. He was started back on lactulose three times daily and by the next morning the patient had returned to his baseline mental  status and was formally oriented to person, place and time. This is the patients third admission for AMS 2/2 hepatic encephalopathy. The patient continued to improve while on service and he was discharged in good condition, hemodynamically stable and at his baseline mental status. His lactulose regimen should continue to be dosed three times daily.  2. Hyperglycemia The patient was hyperglycemic on admission (453) and had multiple readings of hyperglycemia throughout his admission. At the time of admission the patient was taking glipizide 34m BID and had recently been started on Metformin. He was put on sliding scale insulin and continued his BID dosing of glipizide while in the hospital. We did not start the patient on insulin at time of discharge as we were concerned that he would not be adherent to taking insulin in the out patient setting. Please address this with him at the hospital follow-up visit. We discharged him on his home glipizide 58mBID and started him on Metformin 50020mith a titration schedule over the next four weeks. This was written in the patients discharge summary. He will take 500m101me first week and increase by 500mg107mh week until he is taking 2000mg 17mded into 1000mg i34me morning and 1000mg in47m evening.   3. Chronic hepatitis B Infection Hep B DNA PCR > 1,000,000 on 08/08/15.U/S on 8/9 showed coarse echotexture of the liver with somewhat lobulated liver contour,indicative of a degree of underlying parenchymal disease, likely a degree of cirrhosis. He would need ID follow-up but he [place] We discussed with him the importance of getting [place] to see the infectious disease specialists to ensure his Hepatitis B virus is appropriately treated. He endorsed understanding of this.  4. Possible lumbar osteomyelitis Changes on recent Lumbar CT concerning for osteomyelitis. ESR and CRP both elevated. Patient  afebrile without leukocytosis. MRI of the lumbar spine demonstrated marked abnormality of the disc spaces and end place at L3/4, L4/5, and L5/S1. This may be due to chronic treated osteomyelitis. It was reported the patient had difficulty sitting still for the image and the MRI was not of the greatest quality. Consider additional imaging or further workup if needed in the future.  Discharge Vitals:   BP (!) 149/83 (BP Location: Right Arm)   Pulse 93   Temp 98.7 F (37.1 C) (Oral)   Resp (!) 22   Wt 162 lb 6.4 oz (73.7 kg)   SpO2 100%   BMI 23.98 kg/m   Pertinent Labs, Studies, and Procedures:    Discharge Instructions: Discharge Instructions    Diet - low sodium heart healthy    Complete by:  As directed   Discharge instructions    Complete by:  As directed   For metformin please take as follows. For the first week take 1 pill once a day.  For the second week take 1 pill in the morning and 1 pill at night. For the third week take 1 pill in the morning and 2 pills at night. For the fourth week take 2 pills in the morning and 2 pills at night. After the fourth week continue 2 pills in the morning and 2 pills at night.   Increase activity slowly    Complete by:  As directed      Signed: Ophelia Shoulder, MD 09/17/2015, 6:21 PM   Pager: [phone]

## 2015-09-17 NOTE — Discharge Summary (Signed)
Name: Henry Solomon MRN: [id number] DOB: 1960-12-03 55 y.o. PCP: Marliss Coots, NP  Date of Admission: 09/15/2015 11:28 AM Date of Discharge: 09/17/2015 Attending Physician: Axel Filler, MD  Discharge Diagnosis: 1. Hepatic encephalopathy Active Problems:   Diabetes mellitus without complication (HCC)   Hepatic encephalopathy (HCC)   Chronic hepatitis B ([place])   Cirrhosis of liver without ascites ([place])   Discharge Medications:   Medication List    TAKE these medications   amLODipine 10 MG tablet Commonly known as:  NORVASC Take 1 tablet (10 mg total) by mouth daily.   CAL-MAG-ZINC PO Take 1 tablet by mouth daily.   cholecalciferol 1000 units tablet Commonly known as:  VITAMIN D Take 1,000 Units by mouth daily.   feeding supplement (GLUCERNA SHAKE) Liqd Take 237 mLs by mouth 3 (three) times daily between meals.   FLAX SEEDS PO Take 1 capsule by mouth daily.   folic acid 1 MG tablet Commonly known as:  FOLVITE Take 1 tablet (1 mg total) by mouth daily.   gabapentin 300 MG capsule Commonly known as:  NEURONTIN Take 1 capsule (300 mg total) by mouth 3 (three) times daily. What changed:  when to take this   glipiZIDE 5 MG tablet Commonly known as:  GLUCOTROL Take 1 tablet (5 mg total) by mouth 2 (two) times daily before a meal. What changed:  when to take this   glucose blood test strip Use as instructed   lactulose 10 GM/15ML solution Commonly known as:  CHRONULAC Take 45 mLs (30 g total) by mouth 3 (three) times daily. What changed:  additional instructions   metFORMIN 500 MG tablet Commonly known as:  GLUCOPHAGE Take 2 tablets (1,000 mg total) by mouth 2 (two) times daily with a meal. Take as instructed What changed:  how much to take  when to take this  additional instructions   pantoprazole 40 MG tablet Commonly known as:  PROTONIX Take 40 mg by mouth 2 (two) times daily.   vitamin B-12 1000 MCG tablet Commonly known as:   CYANOCOBALAMIN Take 1,000 mcg by mouth daily.       Disposition and follow-up:   [name] was discharged from [facility] in Good condition.  At the hospital follow up visit please address:  1.  If the patient has experienced confusion or any other symptoms since discharge. Assess if the patient is continuing to take his lactulose regimen and the number of daily bowel movements. Ask if the patient is continuing to take glipizide and is appropriately resuming metformin.  2.  Labs / imaging needed at time of follow-up: MRI of lumbar spine for evaluation of osteomyelitis and XR of R hip for chronic pain.   3.  Pending labs/ test needing follow-up: HepA total antibody, HIV antibody, FTA-ABS  Follow-up Appointments:   Follow-up Information    [facility] ACC. Go in 4 day(s).   Why:  Please make sure you go to your appointment in the [facility] Internal Medicine [facility] for hospital follow-up on Tuesday at 10:15 Contact information: [place] Hospital Course by problem list: Active Problems:   Diabetes mellitus without complication ([place])   Hepatic encephalopathy ([place])   Chronic hepatitis B ([place])   Cirrhosis of liver without ascites ([place])   1. Altered Mental Status: This is the patient's 3rd episode of confusion since 07/2015, has been controlled with lactulose 30 gm t.i.d., but  has shown to have rapid onset of confusion when on lower/missing dose of lactulose. Patient presented with transaminitis, AST 226, ALT 171, and alk phos of 170; latest labs show AST 202, ALT 154, and alk phos of 158. Patient with reported 2-year history of chronic hepatitis infection which he endorses being treated for,Hep B DNA PCR >1,000,000 on 08/08/15. He also reports being diagnosed with Hep C in 2007, HCV antibody negative on 08/05/15. Patient transaminitis has worsened since presentation in 08/05/15, when he had AST 89, ALT 84. Patient  has remainedafebrile and vitals have been stable sincepresentation. Drug intoxication, infectious, intracranial etiologies have been investigated and e unlikely.Following 3 days of his lactulose regimen, the patient demonstrated resolution of his confusion, becoming fully alert and oriented to person, place, and time, and returning to his reported baseline.  2. Hyperglycemia: patient with history of type 2 diabetes mellitus, home medications are metformin and glipizide, HbA1c 11.3. Presented with glucose of 453 and urine glucose >1000, bicarb of 18 with normal anion gap. Glucose measurements have consistently been above above 200. There are concerns about patient being able to manage insulin at home. Plan to discharge patient on glipizide 5 mg bid with meals and metformin 1000 mg bid via uptitrating 500 mg per day per week.  3. Chronic hepatitis B infection: Hep B DNA PCR > 1,000,000 on 08/08/15.U/S on 8/9 showed coarse echotexture of the liver with somewhat lobulated liver contour,indicative of a degree of underlying parenchymal disease, likely a degree of cirrhosis. Plan to follow up in the [facility] for treatment and referral to ID.   4. Lower back pain: Patient presented with mild low back pain. Discharged 07/21/15 from [facility] after presenting with suspected lumbar osteomyelitis, but had negative biopsy. Patient spine CT on 08/05/15 showed possible spinal osteomyelitis, MRI thoracic spine showed no thoracic osteomyelitis, and lumbar MRI was unable to be obtained during that admission. Patient was afebrile throughout admission, WBC 4.9. MRI was unable to be obtained due to patient discomfort. Plan to obtain MRI lumbar spine as outpatient for evaluation of osteomyelitis.  5. Hypertension: Patient presented with BP 153/93 and HR 76. He was continued on home amlodipine 10 mg daily and his BP was stable throughout admission.   Discharge Vitals:   BP 138/87 (BP Location: Right  Arm)   Pulse 71   Temp 98.1 F (36.7 C) (Oral)   Resp 20   Wt 73.7 kg (162 lb 6.4 oz)   SpO2 100%   BMI 23.98 kg/m   Pertinent Labs, Studies, and Procedures:  LABS/IMAGING:  Comprehensive metabolic panel     Status: Abnormal   Collection Time: 09/16/15  5:22 AM  Result Value Ref Range   Sodium 139 135 - 145 mmol/L   Potassium 4.0 3.5 - 5.1 mmol/L   Chloride 110 101 - 111 mmol/L   CO2 22 22 - 32 mmol/L   Glucose, Bld 281 (H) 65 - 99 mg/dL   BUN 15 6 - 20 mg/dL   Creatinine, Ser 1.34 (H) 0.61 - 1.24 mg/dL   Calcium 9.1 8.9 - 10.3 mg/dL   Total Protein 6.6 6.5 - 8.1 g/dL   Albumin 2.2 (L) 3.5 - 5.0 g/dL   AST 202 (H) 15 - 41 U/L   ALT 154 (H) 17 - 63 U/L   Alkaline Phosphatase 158 (H) 38 - 126 U/L   Total Bilirubin 0.9 0.3 - 1.2 mg/dL   GFR calc non Af Amer 59 (L) >60 mL/min  GFR calc Af Amer >60 >60 mL/min    Comment: (NOTE) The eGFR has been calculated using the CKD EPI equation. This calculation has not been validated in all clinical situations. eGFR's persistently <60 mL/min signify possible Chronic Kidney Disease.   Anion gap 7 5 - 15  CBC     Status: Abnormal   Collection Time: 09/16/15  5:22 AM  Result Value Ref Range   WBC 4.9 4.0 - 10.5 K/uL   RBC 4.23 4.22 - 5.81 MIL/uL   Hemoglobin 12.6 (L) 13.0 - 17.0 g/dL   HCT 38.5 (L) 39.0 - 52.0 %   MCV 91.0 78.0 - 100.0 fL   MCH 29.8 26.0 - 34.0 pg   MCHC 32.7 30.0 - 36.0 g/dL   RDW 13.8 11.5 - 15.5 %   Platelets PLATELET CLUMPS NOTED ON SMEAR, UNABLE TO ESTIMATE 150 - 400 K/uL    Comment: PLATELET CLUMPS NOTED ON SMEAR, COUNT APPEARS DECREASED  Glucose, capillary     Status: Abnormal   Collection Time: 09/16/15  6:19 AM  Result Value Ref Range   Glucose-Capillary 246 (H) 65 - 99 mg/dL   Comment 1 Notify RN    Comment 2 Document in Chart   C-reactive protein     Status: Abnormal   Collection Time: 09/16/15  9:10 AM  Result Value Ref Range   CRP 1.2 (H) <1.0 mg/dL           C-reactive protein     Status: Abnormal   Collection Time: 09/16/15  9:10 AM  Result Value Ref Range   CRP 1.2 (H) <1.0 mg/dL  Urine rapid drug screen (hosp performed)     Status: None   Collection Time: 09/16/15 11:18 AM  Result Value Ref Range   Opiates NONE DETECTED NONE DETECTED   Cocaine NONE DETECTED NONE DETECTED   Benzodiazepines NONE DETECTED NONE DETECTED   Amphetamines NONE DETECTED NONE DETECTED   Tetrahydrocannabinol NONE DETECTED NONE DETECTED   Barbiturates NONE DETECTED NONE DETECTED  Sedimentation rate     Status: Abnormal   Collection Time: 09/16/15 12:28 PM  Result Value Ref Range   Sed Rate 93 (H) 0 - 16 mm/hr  RPR     Status: None   Collection Time: 09/16/15 12:28 PM  Result Value Ref Range   RPR Ser Ql Non Reactive Non Reactive    Comment: (NOTE) Performed At: [facility] [address] [place], [place] [id number] Lindon Romp MD [id number]   Discharge Instructions:    Diet - low sodium heart healthy    Complete by:  As directed   Discharge instructions    Complete by:  As directed   For metformin please take as follows. For the first week take 1 pill once a day. For the second week take 1 pill in the morning and 1 pill at night. For the third week take 1 pill in the morning and 2 pills at night. For the fourth week take 2 pills in the morning and 2 pills at night. After the fourth week continue 2 pills in the morning and 2 pills at night.   Increase activity slowly    Complete by:  As directed     Signed: Lauralyn Primes, Medical Student 09/17/2015, 12:46 PM

## 2015-09-17 NOTE — Progress Notes (Addendum)
Inpatient Diabetes Program Recommendations  AACE/ADA: New Consensus Statement on Inpatient Glycemic Control (2015)  Target Ranges:  Prepandial:   less than 140 mg/dL      Peak postprandial:   less than 180 mg/dL (1-2 hours)      Critically ill patients:  140 - 180 mg/dL   Results for Henry Solomon, [name] (MRN [id number]) as of 09/17/2015 11:49  Ref. Range 09/16/2015 06:19 09/16/2015 11:37 09/16/2015 16:57 09/16/2015 20:50  Glucose-Capillary Latest Ref Range: 65 - 99 mg/dL 409246 (H) 811332 (H) 85 914223 (H)   Results for Henry Solomon, [name] (MRN [id number]) as of 09/17/2015 11:49  Ref. Range 09/17/2015 06:12 09/17/2015 11:38  Glucose-Capillary Latest Ref Range: 65 - 99 mg/dL 086211 (H) 578472 (H)   Results for Henry Solomon, [name] (MRN [id number]) as of 09/17/2015 11:49  Ref. Range 08/06/2015 05:13 09/16/2015 12:28  Hemoglobin A1C Latest Ref Range: 4.8 - 5.6 % 11.7 (H) 11.3 (H)   Review of Glycemic Control  Diabetes history: DM2  Outpatient DM meds: Glucotrol 5 mg QPM     Metformin 500 mg daily  Current Insulin Orders: Glucotrol 5 mg bid      Novolog Moderate Correction Scale/ SSI (0-15 units) TID AC + HS     -Note through Chart Review, patient saw Dr. Mikey BussingHoffman with the [facility] on 09/10/15 to establish care.  Per notes, patient relies on his mother to help him with his medications.  MD noted that they did not want to start insulin for home at this time.  Started Metformin instead at that office visit (08/04).  -A1c remains elevated.  -Patient does not have insurance and has trouble affording many of his medications.  -Glucose levels quite elevated here in hospital.    MD- Please consider the following in-hospital insulin adjustments to help better control glucose levels in-hospital:  Start Lantus 15 units daily (0.2 units/kg dosing)  Is Metformin OK for patient at home with his liver issues?  MD office visit note from 08/04 stated they did not want to start insulin for patient at home.  Is there  another oral medication we could try for patient instead of Metformin?  We could try adding a DPP- inhibitor medication like Tradjenta 5 mg daily, however, I don't know if patient could afford Tradjenta at home.  Not sure if there are any savings programs available for Tradjenta?     --Will follow patient during hospitalization--  Ambrose FinlandJeannine Johnston Daveah Varone RN, MSN, CDE Diabetes Coordinator Inpatient Glycemic Control Team Team Pager: [phone] (8a-5p)

## 2015-09-17 NOTE — Progress Notes (Signed)
   Subjective: No acute events overnight. Patient's mental status continues to improve. Patient will have MRI of his lumbar spine early this afternoon. Patient has no additional acute complaints or concerns this morning.  Objective:  Vital signs in last 24 hours: Vitals:   09/16/15 2123 09/17/15 0103 09/17/15 0544 09/17/15 0952  BP: 138/81 135/79 (!) 141/87 138/87  Pulse: 85 76 66 71  Resp: _0 Temp: 99 F (37.2 C) 98.6 F (37 C) 98.5 F (36.9 C) 98.1 F (36.7 C)  TempSrc: Oral Oral Oral Oral  SpO2: 100% 100% 100% 100%  Weight:       Physical Exam  Constitutional: He is oriented to person, place, and time. He appears well-developed and well-nourished. No distress.  HENT:  Head: Normocephalic and atraumatic.  Cardiovascular: Normal rate and regular rhythm.  Exam reveals no gallop and no friction rub.   No murmur heard. Respiratory: Effort normal and breath sounds normal.  GI: Soft. Bowel sounds are normal. He exhibits no distension.  Musculoskeletal: He exhibits no edema.  Neurological: He is alert and oriented to person, place, and time.     Assessment/Plan: Mr. Fini is a 55 year old male with a past medical history of chronic hepatitis B, cirrhosis, type 2 diabetes mellitus and hepatic encephalopathy who presents with a 5 day history of confusion and altered mental status likely secondary to hepatic encephalopathy in the setting of decreased lactulose dosage.  1. AMS 2/2 hepatic encephalopathy -- Patient presenting with worsening altered mental status following reduction in lactulose dose. Also not currently taking prescribed rifaximin. Altered mental status is most likely not 2/2 stroke given the patients normal neurological examination. Additionally, the patient's altered mental status may be secondary to drug or toxin exposure. -- continue lactulose to 30 g 3 times a day and titrate to 3-4 bowel movements per day -- UDS- negative -- RPR- Non reactive  -- ESR-  93 -- CRP- 1.2 -- FTA-ABS- will f/u -- MRI without contrast today to evaluate for potential lumbar osteomyelitis  2. Hyperglycemia -- Patient type II diabetic -- Sliding scale insulin -- Continue glipizide 5 mg twice a day -- Resume Metformin at discharge -- Carbohydrate modified diet -- Hemoglobin A1c 11.3  3. Chronic hepatitis B virus infection -- [facility] -- [place] currently lives with his mother.   4. DVT/PE prophylaxis -- Lovenox 40 mg subcutaneous injection once daily  Dispo: Anticipated discharge in approximately 1-2 day(s).   Ophelia Shoulder, MD 09/17/2015, 12:33 PM Pager: [phone]

## 2015-09-18 LAB — HEPATITIS A ANTIBODY, TOTAL: HEP A TOTAL AB: POSITIVE — AB

## 2015-09-18 LAB — HIV ANTIBODY (ROUTINE TESTING W REFLEX): HIV SCREEN 4TH GENERATION: NONREACTIVE

## 2015-09-20 ENCOUNTER — Telehealth: Payer: Self-pay | Admitting: *Deleted

## 2015-09-20 MED FILL — LACTULOSE 10 GM/15 ML SOLN: 10 | 14 days supply | Qty: 1892 | Fill #0

## 2015-09-20 MED FILL — ?METFORMIN HCL 500MG TABLET: 500 | 30 days supply | Qty: 120 | Fill #0

## 2015-09-20 NOTE — Telephone Encounter (Signed)
APT. REMINDER CALL, PHONE DISCONNECTED

## 2015-09-20 NOTE — Progress Notes (Signed)
I saw and evaluated the patient.  I personally confirmed the key portions of Dr. Hoffman's history and exam and reviewed pertinent patient test results.  The assessment, diagnosis, and plan were formulated together and I agree with the documentation in the resident's note. 

## 2015-09-21 ENCOUNTER — Encounter: Payer: Self-pay | Admitting: Internal Medicine

## 2015-09-21 ENCOUNTER — Inpatient Hospital Stay (HOSPITAL_COMMUNITY): Payer: Medicaid Other

## 2015-09-21 ENCOUNTER — Encounter (INDEPENDENT_AMBULATORY_CARE_PROVIDER_SITE_OTHER): Payer: Self-pay

## 2015-09-21 ENCOUNTER — Observation Stay (HOSPITAL_COMMUNITY)
Admission: AD | Admit: 2015-09-21 | Discharge: 2015-09-22 | Disposition: A | Discharge status: Home / Self Care | Payer: Medicaid Other | Source: Ambulatory Visit | Attending: Student in an Organized Health Care Education/Training Program | Admitting: Student in an Organized Health Care Education/Training Program

## 2015-09-21 ENCOUNTER — Ambulatory Visit (INDEPENDENT_AMBULATORY_CARE_PROVIDER_SITE_OTHER): Payer: Self-pay | Admitting: Internal Medicine

## 2015-09-21 VITALS — BP 149/80 | HR 82 | Temp 98.3°F | Wt 177.6 lb

## 2015-09-21 DIAGNOSIS — B181 Chronic viral hepatitis B without delta-agent: Secondary | ICD-10-CM | POA: Diagnosis not present

## 2015-09-21 DIAGNOSIS — K7290 Hepatic failure, unspecified without coma: Secondary | ICD-10-CM | POA: Insufficient documentation

## 2015-09-21 DIAGNOSIS — I10 Essential (primary) hypertension: Secondary | ICD-10-CM | POA: Diagnosis present

## 2015-09-21 DIAGNOSIS — K7682 Hepatic encephalopathy: Secondary | ICD-10-CM

## 2015-09-21 DIAGNOSIS — A523 Neurosyphilis, unspecified: Secondary | ICD-10-CM | POA: Diagnosis present

## 2015-09-21 DIAGNOSIS — R52 Pain, unspecified: Secondary | ICD-10-CM

## 2015-09-21 DIAGNOSIS — I82409 Acute embolism and thrombosis of unspecified deep veins of unspecified lower extremity: Secondary | ICD-10-CM

## 2015-09-21 DIAGNOSIS — M8668 Other chronic osteomyelitis, other site: Secondary | ICD-10-CM | POA: Insufficient documentation

## 2015-09-21 DIAGNOSIS — Z7984 Long term (current) use of oral hypoglycemic drugs: Secondary | ICD-10-CM | POA: Diagnosis not present

## 2015-09-21 DIAGNOSIS — K7460 Unspecified cirrhosis of liver: Secondary | ICD-10-CM | POA: Diagnosis not present

## 2015-09-21 DIAGNOSIS — M7061 Trochanteric bursitis, right hip: Secondary | ICD-10-CM | POA: Insufficient documentation

## 2015-09-21 DIAGNOSIS — E119 Type 2 diabetes mellitus without complications: Secondary | ICD-10-CM

## 2015-09-21 LAB — CBC
HEMATOCRIT: 35.4 % — AB (ref 39.0–52.0)
Hemoglobin: 11.7 g/dL — ABNORMAL LOW (ref 13.0–17.0)
MCH: 30.2 pg (ref 26.0–34.0)
MCHC: 33.1 g/dL (ref 30.0–36.0)
MCV: 91.5 fL (ref 78.0–100.0)
PLATELETS: UNDETERMINED 10*3/uL (ref 150–400)
RBC: 3.87 MIL/uL — ABNORMAL LOW (ref 4.22–5.81)
RDW: 13.8 % (ref 11.5–15.5)
WBC: 5.2 10*3/uL (ref 4.0–10.5)

## 2015-09-21 LAB — COMPREHENSIVE METABOLIC PANEL
ALT: 147 U/L — ABNORMAL HIGH (ref 17–63)
AST: 201 U/L — AB (ref 15–41)
Albumin: 2.3 g/dL — ABNORMAL LOW (ref 3.5–5.0)
Alkaline Phosphatase: 200 U/L — ABNORMAL HIGH (ref 38–126)
Anion gap: 8 (ref 5–15)
BILIRUBIN TOTAL: 0.8 mg/dL (ref 0.3–1.2)
BUN: 28 mg/dL — AB (ref 6–20)
CO2: 20 mmol/L — ABNORMAL LOW (ref 22–32)
Calcium: 9.5 mg/dL (ref 8.9–10.3)
Chloride: 105 mmol/L (ref 101–111)
Creatinine, Ser: 1.7 mg/dL — ABNORMAL HIGH (ref 0.61–1.24)
GFR, EST AFRICAN AMERICAN: 51 mL/min — AB (ref >60)
GFR, EST NON AFRICAN AMERICAN: 44 mL/min — AB (ref >60)
Glucose, Bld: 394 mg/dL — ABNORMAL HIGH (ref 65–99)
POTASSIUM: 4.5 mmol/L (ref 3.5–5.1)
Sodium: 133 mmol/L — ABNORMAL LOW (ref 135–145)
TOTAL PROTEIN: 6.9 g/dL (ref 6.5–8.1)

## 2015-09-21 LAB — CSF CELL COUNT WITH DIFFERENTIAL
RBC COUNT CSF: 1145 /mm3 — AB
RBC Count, CSF: 4 /mm3 — ABNORMAL HIGH
TUBE #: 1
Tube #: 4
WBC, CSF: 0 /mm3 (ref 0–5)
WBC, CSF: 0 /mm3 (ref 0–5)

## 2015-09-21 LAB — FLUORESCENT TREPONEMAL AB(FTA)-IGG-BLD: FLUORESCENT TREPONEMAL AB, IGG: REACTIVE — AB

## 2015-09-21 LAB — GLUCOSE, CAPILLARY
Glucose-Capillary: 398 mg/dL — ABNORMAL HIGH (ref 65–99)
Glucose-Capillary: 196 mg/dL — ABNORMAL HIGH (ref 65–99)

## 2015-09-21 LAB — PROTEIN AND GLUCOSE, CSF
GLUCOSE CSF: 161 mg/dL — AB (ref 40–70)
Total  Protein, CSF: 28 mg/dL (ref 15–45)

## 2015-09-21 MED ORDER — VITAMIN B-12 1000 MCG PO TABS
1000.0000 ug | ORAL_TABLET | Freq: Every day | ORAL | Status: DC
  Administered 2015-09-22: 1000 ug via ORAL
  Filled 2015-09-21: qty 1

## 2015-09-21 MED ORDER — LACTULOSE 10 GM/15ML PO SOLN
30.0000 g | Freq: Three times a day (TID) | ORAL | Status: DC
  Administered 2015-09-21 – 2015-09-22 (×3): 30 g via ORAL
  Filled 2015-09-21 (×3): qty 45

## 2015-09-21 MED ORDER — VITAMIN D 1000 UNITS PO TABS
1000.0000 [IU] | ORAL_TABLET | Freq: Every day | ORAL | Status: DC
  Administered 2015-09-22: 1000 [IU] via ORAL
  Filled 2015-09-21: qty 1

## 2015-09-21 MED ORDER — PANTOPRAZOLE SODIUM 40 MG PO TBEC
40.0000 mg | DELAYED_RELEASE_TABLET | Freq: Two times a day (BID) | ORAL | Status: DC
  Administered 2015-09-21 – 2015-09-22 (×2): 40 mg via ORAL
  Filled 2015-09-21 (×2): qty 1

## 2015-09-21 MED ORDER — INSULIN ASPART 100 UNIT/ML ~~LOC~~ SOLN
0.0000 [IU] | Freq: Three times a day (TID) | SUBCUTANEOUS | Status: DC
  Administered 2015-09-21: 9 [IU] via SUBCUTANEOUS
  Administered 2015-09-22 (×2): 7 [IU] via SUBCUTANEOUS

## 2015-09-21 MED ORDER — GLUCERNA SHAKE PO LIQD
237.0000 mL | Freq: Three times a day (TID) | ORAL | Status: DC
  Administered 2015-09-21 – 2015-09-22 (×4): 237 mL via ORAL
  Filled 2015-09-21 (×7): qty 237

## 2015-09-21 MED ORDER — FOLIC ACID 1 MG PO TABS
1.0000 mg | ORAL_TABLET | Freq: Every day | ORAL | Status: DC
  Administered 2015-09-22: 1 mg via ORAL
  Filled 2015-09-21: qty 1

## 2015-09-21 MED ORDER — AMLODIPINE BESYLATE 10 MG PO TABS
10.0000 mg | ORAL_TABLET | Freq: Every day | ORAL | Status: DC
  Administered 2015-09-22: 10 mg via ORAL
  Filled 2015-09-21: qty 1

## 2015-09-21 MED ORDER — GABAPENTIN 300 MG PO CAPS
300.0000 mg | ORAL_CAPSULE | Freq: Every day | ORAL | Status: DC
  Administered 2015-09-21: 300 mg via ORAL
  Filled 2015-09-21: qty 1

## 2015-09-21 MED ORDER — INSULIN ASPART 100 UNIT/ML ~~LOC~~ SOLN: 0.0000 [IU] | Freq: Every day | SUBCUTANEOUS | Status: DC

## 2015-09-21 NOTE — Assessment & Plan Note (Signed)
BP Readings from Last 3 Encounters:  09/21/15 (!) 149/80  09/17/15 (!) 149/83  08/27/15 (!) 155/73    Lab Results  Component Value Date   NA 139 09/16/2015   K 4.0 09/16/2015   CREATININE 1.34 (H) 09/16/2015    Assessment: Blood pressure control:  Elevated Progress toward BP goal:   Above goal Comments: Compliant with amlodipine 10 mg daily.  Plan: Medications:  continue current medications Other plans: Consider addition of antihypertensive if persistently elevated during admission.

## 2015-09-21 NOTE — Assessment & Plan Note (Addendum)
Patient was recently admitted on 09/15/2015 with hepatic encephalopathy 2/2 Child Pugh B Cirrhosis due to Chronic Hepatitis B. The patient had a clinic appointment on 09/10/2015 at which time his lactulose dose was decreased from three times daily to twice daily. Patient was supposed to titrate the lactulose up according to his bowel movement to keep him at 3 bowel movements per day, however, this was not done. During admission, patient was started back on lactulose 30 grams three times daily and by the next morning the patient had returned to his baseline mental status and was formally oriented to person, place and time. However, there was concern over the sensitivity of his encephalopathy given how preserved his hepatic function is with normal bilirubin, normal INR, and no ascites. To further workup his encephalopathy, RPR was checked which was negative. Although negative, patient was still at risk for neurosyphilis given encephalopathy out of proportion to hepatic dysfunction, and history of sexually transmitted infections. FTA treponemal antibody returned positive two days after discharge. According to the literature, nontreponemal tests like RPR and VDRL may be nonreactive in late neurosyphilis. Today in clinic, patient is oriented to person, place and time with 5 bowel movements per day. No evdidence of asterixis or ascites on exam. He states he had an LP in June 2017, but was not tested for neurosyphillis that he is aware of. He does admit to a past history of STDs, but cannot remember all of them. Patient and his mother are agreeable to admission for further work up of possible Neurosyphilis.   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

## 2015-09-21 NOTE — Procedures (Signed)
Lumbar Puncture Procedure Note  Pre-operative Diagnosis:  Rule out neurosyphilis  Indications:  Rule out neurosyphilis  Procedure Details:  Informed consent was obtained after explanation of the risks and benefits of the procedure, refer to the consent documentation.  Time-out performed immediately prior to the procedure.  Patient was placed in the sitting position.  The superior aspect of the iliac crests were identified, with the traverse demarcating the L4-L5 interspace.  This area was prepped and draped in the usual sterile fashion. Maximum sterile technique was used including antiseptics, cap, gloves, gown, hand hygiene, mask, and sterile sheet.  Local anesthesia with 1% lidocaine was applied subcutaneously then deep to the skin. The spinal needle with trocar was introduced with frequent removal of the trocar to evaluate for cerebrospinal fluid. When this fluid was noted  samples were collected in four separate tubes and sent to the lab after proper labeling. The spinal needle with trocar was removed, with minimal bleeding noted upon removal. A sterile bandage was placed over the puncture site after holding pressure.  A spinal needle was inserted at the L4 - L5 interspace.   Findings: 12mL of clear spinal fluid was obtained. Tube 1 was sent for cell count with differential  Tube 2 was sent for VDRL and FTA-ABS Tube 3 was sent for Protein and Glucose Tube 4 was sent for cell count with differential   Condition:   The patient tolerated the procedure well and remains in the same condition as pre-procedure.  Complications: None; patient tolerated the procedure well.  Plan: Pt to remain supine for 1 hour.

## 2015-09-21 NOTE — Assessment & Plan Note (Signed)
Hep B DNA PCR > 1,000,000 on 08/08/15.U/S on 8/9 showed coarse echotexture of the liver with somewhat lobulated liver contour,indicative of a degree of underlying parenchymal disease, likely a degree of cirrhosis.   Plan: -Referral to ID on discharge

## 2015-09-21 NOTE — Progress Notes (Signed)
Patient admitted to room 5C06 at this time as direct admit. Awaiting MD's orders.

## 2015-09-21 NOTE — Progress Notes (Signed)
Case discussed with Dr. Burns at the time of the visit. We reviewed the resident's history and exam and pertinent patient test results. I agree with the assessment, diagnosis, and plan of care documented in the resident's note. 

## 2015-09-21 NOTE — H&P (Signed)
Date: 09/21/2015               Patient Name:  Henry Solomon MRN: [id number]  DOB: [birth year] Age / Sex: 55 y.o., male   PCP: Lavinia SharpsMary Ann Placey, NP         Medical Service: Internal Medicine Teaching Service         Attending Physician: Dr. Tyson Aliasuncan Thomas Vincent, MD    First Contact: Dr. Thomasene LotJames Shakeia Krus, MD Pager: [phone]  Second Contact: Dr. Hyacinth Meekerasrif Ahmed, MD Pager: [phone]       After Hours (After 5p/  First Contact Pager: [phone]  weekends / holidays): Second Contact Pager: [phone]   Chief Complaint: Possible late Neurosyphilis  History of Present Illness: Patient was recently admitted on 09/15/2015 with hepatic encephalopathy secondary to Child Pugh B cirrhosis due to chronic hepatitis B.  At his previous admission the leading cause for his increased hepatic encephalopathy was a reduction in his lactulose medication. However, there is concern over the sensitivity of his encephalopathy given his preserved  hepatic function with a normal bilirubin, INR and without ascites. To further evaluate his encephalopathy, RPR was checked and was negative. Although negative the patient is still at high risk for neurosyphilis given encephalopathy out of proportion to hepatic dysfunction and prior history of sexually transmitted infections. For further workup an FTA treponemal antibody was ordered. This returned positive two days following the patient's discharge. It has been documented in the literature that non-treponemal tests including RPR may be nonreactive in late neurosyphilis. Currently the patient is asymptomatic and is not encephalopathic. [facility] to further evaluate for potential late neurosyphilis.  Meds:  No outpatient prescriptions have been marked as taking for the 09/21/15 encounter [facility](Hospital Encounter).     Allergies: Allergies as of 09/21/2015  . (No Known Allergies)   Past Medical History:  Diagnosis Date  . Chronic lower back pain    . GERD (gastroesophageal reflux disease)   . Hepatitis B   . Hypertension   . Tuberculosis    "earlier this year; got it cleared up w/treatment" (08/05/2015)  . Type II diabetes mellitus (HCC)     Family History: Positive for diabetes, chronic kidney disease and hypertension  Social History: Denies alcohol, tobacco or illicit drug use  Review of Systems: A complete ROS was negative except as per HPI.   Physical Exam: Blood pressure (!) 148/82, pulse 77, temperature 98 F (36.7 C), temperature source Oral, resp. rate 20, SpO2 100 %. Physical Exam  Constitutional: He is oriented to person, place, and time. He appears well-developed and well-nourished. No distress.  HENT:  Head: Normocephalic and atraumatic.  Cardiovascular: Normal rate and regular rhythm.  Exam reveals no gallop and no friction rub.   No murmur heard. GI: Soft. Bowel sounds are normal. He exhibits no distension. There is no tenderness.  Musculoskeletal: He exhibits no edema.  Neurological: He is alert and oriented to person, place, and time.    Assessment & Plan by Problem: Mr. [name] is a 55 year old male with a past medical history of chronic hepatitis B, cirrhosis, type 2 diabetes and hepatic encephalopathy who presents for lumbar puncture for rule out of potential late neurosyphilis given positive antibody test in the setting of negative non-treponemal test.  1. Potential neurosyphilis -- Patient with negative RPR but positive antibody test. There is concern for potential neurosyphilis given the patient's encephalopathy which seems to be out of proportion to his hepatic  dysfunction. -- Lumbar puncture  2. Hepatic encephalopathy -- Patient currently not encephalopathic. Oriented to person place and time. No asterixis on examination. He seems well compensated given his hepatic dysfunction. -- Continue lactulose 30 g 3 times daily  3. Diabetes -- Patient with history of diabetes. He is compliant with his  medication. -- Continue glipizide 5 mg twice a day -- Hold metformin while admitted -- Sliding scale insulin per protocol  4. Chronic hepatitis B -- Hepatitis B DNA by PCR greater than 1 million on 08/08/15. Ultrasound on 8/9 showed coarse echotexture of the liver with somewhat lobulated liver contour. -- We'll need referral to infectious diseases upon discharge  5. DVT/PE prophylaxis -- We will hold subcutaneous Lovenox until after lumbar puncture  Dispo: Admit patient to Observation with expected length of stay less than 2 midnights.  Signed: Thomasene LotJames Hayden Kihara, MD 09/21/2015, 3:27 PM  Pager: [phone]

## 2015-09-21 NOTE — Assessment & Plan Note (Signed)
Patient reports compliance with glipizide 5mg  BID and Metformin 500 mg BID.   Plan: -Continue glipizide 5 mg BID  -Hold Metformin while admitted -SSI-S ACHS while admitted

## 2015-09-21 NOTE — Assessment & Plan Note (Addendum)
Patient was recently admitted on 09/15/2015 with hepatic encephalopathy 2/2 Child Pugh B Cirrhosis due to Chronic Hepatitis B. The patient had a clinic appointment on 09/10/2015 at which time his lactulose dose was decreased from three times daily to twice daily. Patient was supposed to titrate the lactulose up according to his bowel movement to keep him at 3 bowel movements per day, however, this was not done. During admission, patient was started back on lactulose 30 grams three times daily and by the next morning the patient had returned to his baseline mental status and was formally oriented to person, place and time. Today in clinic, patient is oriented x 3 without evidence of asterixis on exam. He is having 5 bowel movements per day.  Plan: -Continue Lactulose 30 grams TID

## 2015-09-21 NOTE — Progress Notes (Signed)
    CC: Encephalopathy  HPI: [name] is a 55 y.o. male with PMHx of Child-Pugh B Cirrhosis 2/2 Chronic Hepatitis B, T2DM, and HTN who presents to the clinic for follow up for hepatic encephalopathy 2/2 cirrhosis.   Patient was recently admitted on 09/15/2015 with hepatic encephalopathy 2/2 Child Pugh B Cirrhosis due to Chronic Hepatitis B. The patient had a clinic appointment on 09/10/2015 at which time his lactulose dose was decreased from three times daily to twice daily. Patient was supposed to titrate the lactulose up according to his bowel movement to keep him at 3 bowel movements per day, however, this was not done. During admission, patient was started back on lactulose 30 grams three times daily and by the next morning the patient had returned to his baseline mental status and was formally oriented to person, place and time. However, there was concern over the sensitivity of his encephalopathy given how preserved his hepatic function is with normal bilirubin, normal INR, and no ascites. To further workup his encephalopathy, RPR was checked which was negative. Although negative, patient was still at risk for neurosyphilis given encephalopathy out of proportion to hepatic dysfunction, and history of sexually transmitted infections. FTA treponemal antibody returned positive two days after discharge. According to the literature, nontreponemal tests like RPR and VDRL may be nonreactive in late neurosyphilis. Today in clinic, patient is oriented to person, place and time with 5 bowel movements per day. No evdidence of asterixis or ascites on exam. He states he had an LP in June 2017, but was not tested for neurosyphillis that he is aware of. He does admit to a past history of STDs, but cannot remember all of them. Patient and his mother are agreeable to admission for further work up of possible Neurosyphilis.    Past Medical History:  Diagnosis Date  . Chronic lower back pain   . GERD  (gastroesophageal reflux disease)   . Hepatitis B   . Hypertension   . Tuberculosis    "earlier this year; got it cleared up w/treatment" (08/05/2015)  . Type II diabetes mellitus (HCC)    Review of Systems: A complete ROS was negative except as noted in HPI.   Physical Exam: Vitals:   09/21/15 1004  BP: (!) 149/80  Pulse: 82  Temp: 98.3 F (36.8 C)  TempSrc: Oral  SpO2: 100%  Weight: 177 lb 9.6 oz (80.6 kg)   General: Vital signs reviewed.  Patient is chronically ill appearing, in no acute distress and cooperative with exam.  Head: Normocephalic and atraumatic. Eyes: PERRLA, conjunctivae normal, no scleral icterus.  Neck: Supple, trachea midline,no carotid bruit present.  Cardiovascular: RRR, S1 normal, S2 normal, no murmurs, gallops, or rubs. Pulmonary/Chest: Clear to auscultation bilaterally, no wheezes, rales, or rhonchi. Abdominal: Soft, non-tender, non-distended, BS +, no fluid wave.  Extremities: No lower extremity edema bilaterally Neurological: A&O x3, Strength is normal and symmetric bilaterally, cranial nerve II-XII are grossly intact, no focal motor deficit, sensory intact to light touch bilaterally. No asterixis. Skin: Warm, dry and intact. No rashes or erythema. Psychiatric: Flat mood and affect. Speech and behavior is normal. Cognition and memory are abnormal.   Assessment & Plan:  See encounters tab for problem based medical decision making. Patient discussed with Dr. Josem KaufmannKlima

## 2015-09-21 NOTE — Assessment & Plan Note (Signed)
Changes on recent Lumbar CT concerning for osteomyelitis. ESR and CRP both elevated. Patient afebrile without leukocytosis. MRI of the lumbar spine demonstrated marked abnormality of the disc spaces and end place at L3/4, L4/5, and L5/S1. This may be due to chronic treated osteomyelitis. It was reported the patient had difficulty sitting still for the image and the MRI was not of the greatest quality. Patient may need further work up for this prior to LP.

## 2015-09-22 ENCOUNTER — Encounter (HOSPITAL_COMMUNITY): Payer: Self-pay

## 2015-09-22 DIAGNOSIS — A523 Neurosyphilis, unspecified: Secondary | ICD-10-CM | POA: Diagnosis not present

## 2015-09-22 LAB — GLUCOSE, CAPILLARY
GLUCOSE-CAPILLARY: 312 mg/dL — AB (ref 65–99)
Glucose-Capillary: 342 mg/dL — ABNORMAL HIGH (ref 65–99)

## 2015-09-22 LAB — VDRL, CSF: SYPHILIS VDRL QUANT CSF: NONREACTIVE

## 2015-09-22 MED ORDER — IBUPROFEN 200 MG PO TABS
600.0000 mg | ORAL_TABLET | Freq: Once | ORAL | Status: AC
Start: 2015-09-22 — End: 2015-09-22
  Administered 2015-09-22: 600 mg via ORAL
  Filled 2015-09-22: qty 3

## 2015-09-22 MED ORDER — GLUCOSE BLOOD VI STRP: ORAL_STRIP | 11 refills | Status: AC

## 2015-09-22 MED ORDER — IBUPROFEN 200 MG PO TABS: 400.0000 mg | ORAL_TABLET | Freq: Three times a day (TID) | ORAL | 0 refills | Status: AC | PRN

## 2015-09-22 NOTE — Care Management Note (Signed)
Case Management Note  Patient Details  Name: Henry Solomon MRN: [id number] Date of Birth: 04-04-1960  Subjective/Objective:                    Action/Plan: Pt discharging home with self care. CM met with pt and he states he has transportation home. Pt also seen in Internal Medicine Clinic and they assist him with his medications per Pt. Only new medication is Ibuprofen and that can be purchased over the counter. No further needs per CM.   Expected Discharge Date:   (Pending)               Expected Discharge Plan:  Home/Self Care  In-House Referral:     Discharge planning Services     Post Acute Care Choice:    Choice offered to:     DME Arranged:    DME Agency:     HH Arranged:    [place] Agency:     Status of Service:  Completed, signed off  If discussed at [employer] of Stay Meetings, dates discussed:    Additional Comments:  Pollie Friar, RN 09/22/2015, 1:09 PM

## 2015-09-22 NOTE — Progress Notes (Signed)
Pts mother here to pick up pt and states, "pt out of glucose strips and not on insulin at home."  Paged team at this time bc pt has been getting insulin here and cbgs have been consistently in upper 300s.  Dr. Ladona Ridgelaylor to call back with plan.

## 2015-09-22 NOTE — Progress Notes (Signed)
Dc instructions given to pt at this time.  Pt verbalized understanding of all instructions.  No s/s of any acute distress.  No c/o pain at this time.

## 2015-09-22 NOTE — Progress Notes (Signed)
   Subjective: No acute events overnight. Patient is asymptomatic this morning. Patient had lumbar puncture yesterday evening with a CSF profile not consistent with neurosyphilis. His mental status is normal this morning on his usual dose of lactulose. He has no additional acute complaints or concerns this morning. He will be discharged this afternoon.  Objective:  Vital signs in last 24 hours: Vitals:   09/21/15 2151 09/22/15 0100 09/22/15 0500 09/22/15 0946  BP: (!) 146/75 (!) 142/81 138/72 (!) 147/85  Pulse: 79 80 76 74  Resp: 20 20  16   Temp: 99.1 F (37.3 C) 99 F (37.2 C) 98.8 F (37.1 C) 99.2 F (37.3 C)  TempSrc: Oral Oral Oral Oral  SpO2: 100% 100% 100% 100%  Weight:      Height:       Physical Exam  Constitutional: He is oriented to person, place, and time. He appears well-developed and well-nourished.  In no acute distress  HENT:  Head: Normocephalic and atraumatic.  Cardiovascular: Normal rate and regular rhythm.  Exam reveals no gallop and no friction rub.   No murmur heard. Respiratory: Effort normal and breath sounds normal. No respiratory distress. He has no wheezes.  GI: Soft. Bowel sounds are normal. He exhibits distension. There is no tenderness.  Patient's abdomen seems mildly distended on examination this morning. He has no pain upon palpation in any abdominal quadrants.  Musculoskeletal: He exhibits no edema.  Neurological: He is alert and oriented to person, place, and time.     Assessment/Plan:  Henry Solomon is a 55 year old male with a past medical history of chronic hepatitis B, cirrhosis, type 2 diabetes and hepatic encephalopathy who presents for lumbar puncture for rule out of potential late neurosyphilis given positive antibody test in the setting of negative non-treponemal test.  1. Potential neurosyphilis, Negative and Resolved -- Patient with negative RPR but positive antibody test. There is concern for potential neurosyphilis given the patient's  encephalopathy which seems to be out of proportion to his hepatic dysfunction. -- Lumbar puncture with a CSF profile inconsistent with the diagnosis of neurosyphilis -- Patient will be discharged today  2. Hepatic encephalopathy -- Patient currently not encephalopathic. Oriented to person place and time. No asterixis on examination. He seems well compensated given his hepatic dysfunction. -- Continue lactulose 30 g 3 times daily  3. Diabetes -- Patient with history of diabetes. He is compliant with his medication. -- Continue glipizide 5 mg twice a day -- Hold metformin while admitted -- Sliding scale insulin per protocol  4. Chronic hepatitis B -- Hepatitis B DNA by PCR greater than 1 million on 08/08/15. Ultrasound on 8/9 showed coarse echotexture of the liver with somewhat lobulated liver contour. -- We'll need referral to infectious diseases upon discharge  5. DVT/PE prophylaxis -- We will hold subcutaneous Lovenox until after lumbar puncture  6. Right Hip Pain -- Patient with several month history of right hip pain. Diagnostic imaging shows no acute abnormality of the right hip. This is most likely secondary to trochanteric bursitis. -- Ibuprofen 400 mg 3 times a day for 7 days -- Apply cold and warm therapy to painful area  Dispo: Anticipated discharge today.   Thomasene LotJames Ketina Mars, MD 09/22/2015, 10:29 AM Pager: [phone]

## 2015-09-22 NOTE — Care Management Note (Signed)
Case Management Note  Patient Details  Name: Henry NovemberScottie L Tilson MRN: [id number] Date of Birth: [birth year]  Subjective/Objective:    Pt in to r/o neurosyphilis. He is from home with his mother. Pt is without insurance but is seen in Internal Medicine Clinic.                 Action/Plan: Continuing medical work up. CM following for discharge needs.   Expected Discharge Date:   (Pending)               Expected Discharge Plan:  Home/Self Care  In-House Referral:     Discharge planning Services     Post Acute Care Choice:    Choice offered to:     DME Arranged:    DME Agency:     HH Arranged:    HH Agency:     Status of Service:  In process, will continue to follow  If discussed at Long Length of Stay Meetings, dates discussed:    Additional Comments:  Kermit BaloKelli F Jaleen Grupp, RN 09/22/2015, 10:37 AM

## 2015-09-22 NOTE — Discharge Summary (Signed)
Name: Henry Solomon MRN: [id number] DOB: Nov 24, 1960 55 y.o. PCP: Henry SharpsMary Ann Placey, NP  Date of Admission: 09/21/2015  2:44 PM Date of Discharge: 09/22/2015 Attending Physician: Henry Aliasuncan Thomas Vincent, MD  Discharge Diagnosis: 1. Neurosyphilis rule out  Discharge Medications:   Medication List    TAKE these medications   amLODipine 10 MG tablet Commonly known as:  NORVASC Take 1 tablet (10 mg total) by mouth daily.   CAL-MAG-ZINC PO Take 1 tablet by mouth daily.   cholecalciferol 1000 units tablet Commonly known as:  VITAMIN D Take 1,000 Units by mouth daily.   feeding supplement (GLUCERNA SHAKE) Liqd Take 237 mLs by mouth 3 (three) times daily between meals. What changed:  when to take this   FLAX SEEDS PO Take 1 capsule by mouth daily.   folic acid 1 MG tablet Commonly known as:  FOLVITE Take 1 tablet (1 mg total) by mouth daily.   gabapentin 300 MG capsule Commonly known as:  NEURONTIN Take 1 capsule (300 mg total) by mouth 3 (three) times daily. What changed:  when to take this   glipiZIDE 5 MG tablet Commonly known as:  GLUCOTROL Take 1 tablet (5 mg total) by mouth 2 (two) times daily before a meal. What changed:  when to take this   glucose blood test strip Use as instructed   ibuprofen 200 MG tablet Commonly known as:  ADVIL,MOTRIN Take 2 tablets (400 mg total) by mouth every 8 (eight) hours as needed.   lactulose 10 GM/15ML solution Commonly known as:  CHRONULAC Take 45 mLs (30 g total) by mouth 3 (three) times daily.   metFORMIN 500 MG tablet Commonly known as:  GLUCOPHAGE Take 2 tablets (1,000 mg total) by mouth 2 (two) times daily with a meal. Take as instructed   pantoprazole 40 MG tablet Commonly known as:  PROTONIX Take 40 mg by mouth 2 (two) times daily.   vitamin B-12 1000 MCG tablet Commonly known as:  CYANOCOBALAMIN Take 1,000 mcg by mouth daily.       Disposition and follow-up:   [facility]  At the hospital follow up visit please address: antibody  1.  Please follow-up with CSF VDRL, and with CSF FTA-ABS.  2.  Labs / imaging needed at time of follow-up: None  3.  Pending labs/ test needing follow-up: None  Follow-up Appointments: Patient will only need to follow up in the outpatient setting as needed. He had a clinic appointment on 09/21/2015 and does not need a second clinic appointment scheduled at this time.  Hospital Course by problem list: Active Problems:   Type 2 diabetes mellitus without complication, without long-term current use of insulin (HCC)   Hepatic encephalopathy (HCC)   Chronic hepatitis B (HCC)   Cirrhosis of liver without ascites (HCC)   Essential hypertension   Neurosyphilis in male   1. Neurosyphilis rule out Patient was recently admitted on 09/15/2015 with hepatic encephalopathy secondary to Child Pugh B cirrhosis due to chronic hepatitis B.  At his previous admission the leading cause for his increased hepatic encephalopathy was a reduction in his lactulose medication. However, there is concern over the sensitivity of his encephalopathy given his preserved  hepatic function with a normal bilirubin, INR and without ascites. To further evaluate his encephalopathy, RPR was checked and was negative. Although negative the patient is still at high risk for neurosyphilis given encephalopathy out of proportion to hepatic dysfunction  and prior history of sexually transmitted infections. For further workup an FTA treponemal antibody was ordered. This returned positive two days following the patient's discharge. To further evaluate this positive result the patient was readmitted to the internal medicine teaching service for lumbar puncture to definitively rule out neurosyphilis. The patient had a lumbar puncture on 09/21/2015 without complication. CSF profile was not consistent with a diagnosis of neurosyphilis. A treponemal and  non-treponemal test at the CSF have been sent out to confirm these results and will need follow-up. However, based on the patient's CSF profile the diagnosis of neurosyphilis has appropriately been ruled out. For additional details regarding the patient's prior hospital admission on 09/15/2015 please review and read the discharge summary preceding this one.  2. Trochanteric bursitis The patient has endorsed right hip pain on this hospital admission as well as his previous hospital admission. Additionally, the patient was previously scheduled for right hip diagnostic imaging to evaluate the etiology of the patient's right hip pain. However, he was not able to have this imaging done as he was admitted to the hospital for altered mental status. During this hospitalization we ordered a diagnostic right hip x-ray which did not show any acute, focal or bony abnormality. The patient's right hip pain is most likely secondary to trochanteric bursitis. He was instructed to take ibuprofen 400 mg 3 times daily for a week, rest and apply cold and heat therapy to the painful areas until he has improvement.  Discharge Vitals:   BP (!) 147/85 (BP Location: Left Arm)   Pulse 74   Temp 99.2 F (37.3 C) (Oral)   Resp 16   Ht 5\' 9"  (1.753 m)   Wt 177 lb 9.6 oz (80.6 kg)   SpO2 100%   BMI 26.23 kg/m   Pertinent Labs, Studies, and Procedures:  1. Lumbar puncture with CSF profile inconsistent for the diagnosis of neurosyphilis 2. Right hip diagnostic imaging without any focal or acute bony abnormality  Discharge Instructions: Discharge Instructions    Discharge instructions    Complete by:  As directed   Please take ibuprofen 400 mg 3 times daily for 1 week for right hip pain. Additionally, please use warm and cold compresses over the right hip to help with her pain. Your x-rays were normal and your pain is most likely caused by condition known as trochanteric bursitis. Her pain should improve with ibuprofen, rest  and hot and cold therapy. Please follow up with her outpatient physician if the pain does not improve or continues to worsen.  Please continue to take the lactulose 3 times daily. Additionally, please continue to take all of your medications as prescribed.   Increase activity slowly    Complete by:  As directed      Signed: Thomasene LotJames Maddyx Wieck, MD 09/22/2015, 2:06 PM   Pager: [phone]

## 2015-09-22 NOTE — Progress Notes (Signed)
Dc plan is now complete.  Pt will go home and continue on po diabetic meds.  Pts mother says she controls better at home with her diabetic heart healthy cooking and team fearful of pt having hypoglycemic event.  Pt leaving at this time.  No s/s of any acute distress.

## 2015-09-22 NOTE — Progress Notes (Signed)
Results for Henry Solomon, Henry Solomon (MRN [id number]) as of 09/22/2015 11:06  Ref. Range 09/17/2015 12:12 09/17/2015 17:15 09/21/2015 16:35 09/21/2015 21:32 09/22/2015 05:57  Glucose-Capillary Latest Ref Range: 65 - 99 mg/dL 409499 (H) 811339 (H) 914398 (H) 196 (H) 312 (H)   Noted that blood sugars continue to be greater than 300 mg/dl.  HgbA1C is 11.3%. Recommend starting on a low dose basal insulin.  Weight based dosage for Lantus would be Lantus 16 units daily (80 kg X 0.2 units/kg). Continue Novolog correction scale.  May need Novolog MODERATE correction scale TID & HS. Affordability of Lantus may be an issue. If discharged on insulin, may need to be changed to 70/30 insulin that can be purchased at [employer]. Will continue to monitor blood sugars while in the hospital.

## 2015-09-23 LAB — FLUORESCENT TREPONEMAL AB(FTA)-IGG-BLD

## 2015-09-24 ENCOUNTER — Encounter: Payer: Self-pay | Admitting: *Deleted

## 2015-09-24 MED FILL — TRUE METRIX TEST STRIP: 50 days supply | Qty: 100 | Fill #0

## 2015-09-24 MED FILL — TRUEplus LANCETS 28G MISC: 50 days supply | Qty: 100 | Fill #0

## 2015-09-24 MED FILL — !TRUE METRIX BLOOD GLUCOSE: 1 days supply | Qty: 1 | Fill #0

## 2015-09-25 LAB — CSF CULTURE

## 2015-09-25 LAB — CSF CULTURE W GRAM STAIN: Culture: NO GROWTH

## 2015-09-27 ENCOUNTER — Ambulatory Visit: Payer: Self-pay

## 2015-10-01 ENCOUNTER — Ambulatory Visit: Payer: Self-pay

## 2015-10-01 ENCOUNTER — Ambulatory Visit (INDEPENDENT_AMBULATORY_CARE_PROVIDER_SITE_OTHER): Payer: Medicaid Other | Admitting: Internal Medicine

## 2015-10-01 VITALS — BP 144/87 | HR 75 | Temp 98.7°F | Ht 69.0 in | Wt 182.7 lb

## 2015-10-01 DIAGNOSIS — E119 Type 2 diabetes mellitus without complications: Secondary | ICD-10-CM | POA: Diagnosis not present

## 2015-10-01 DIAGNOSIS — K7290 Hepatic failure, unspecified without coma: Secondary | ICD-10-CM | POA: Diagnosis not present

## 2015-10-01 DIAGNOSIS — I10 Essential (primary) hypertension: Secondary | ICD-10-CM | POA: Diagnosis not present

## 2015-10-01 DIAGNOSIS — B181 Chronic viral hepatitis B without delta-agent: Secondary | ICD-10-CM

## 2015-10-01 DIAGNOSIS — K7682 Hepatic encephalopathy: Secondary | ICD-10-CM

## 2015-10-01 DIAGNOSIS — A523 Neurosyphilis, unspecified: Secondary | ICD-10-CM | POA: Diagnosis not present

## 2015-10-01 DIAGNOSIS — M25551 Pain in right hip: Secondary | ICD-10-CM

## 2015-10-01 MED ORDER — GABAPENTIN 300 MG PO CAPS: 300.0000 mg | ORAL_CAPSULE | Freq: Three times a day (TID) | ORAL | 2 refills | Status: AC

## 2015-10-01 MED ORDER — METFORMIN HCL 500 MG PO TABS: 1000.0000 mg | ORAL_TABLET | Freq: Two times a day (BID) | ORAL | 0 refills | Status: AC

## 2015-10-01 MED ORDER — GLIPIZIDE 5 MG PO TABS: 5.0000 mg | ORAL_TABLET | Freq: Two times a day (BID) | ORAL | 0 refills | Status: AC

## 2015-10-01 MED ORDER — AMLODIPINE BESYLATE 10 MG PO TABS: 10.0000 mg | ORAL_TABLET | Freq: Every day | ORAL | 1 refills | Status: AC

## 2015-10-01 MED ORDER — PANTOPRAZOLE SODIUM 40 MG PO TBEC: 40.0000 mg | DELAYED_RELEASE_TABLET | Freq: Two times a day (BID) | ORAL | 2 refills | Status: AC

## 2015-10-01 MED ORDER — FOLIC ACID 1 MG PO TABS: 1.0000 mg | ORAL_TABLET | Freq: Every day | ORAL | 1 refills | Status: AC

## 2015-10-01 MED ORDER — VITAMIN B-12 1000 MCG PO TABS: 1000.0000 ug | ORAL_TABLET | Freq: Every day | ORAL | 2 refills | Status: AC

## 2015-10-01 MED FILL — LACTULOSE 10 GM/15 ML SOLN: 10 | 14 days supply | Qty: 1892 | Fill #1

## 2015-10-01 NOTE — Progress Notes (Signed)
Internal Medicine Clinic Attending  I saw and evaluated the patient.  I personally confirmed the key portions of the history and exam documented by Dr. Molt and I reviewed pertinent patient test results.  The assessment, diagnosis, and plan were formulated together and I agree with the documentation in the resident's note. 

## 2015-10-01 NOTE — Assessment & Plan Note (Addendum)
Patient reports compliance with his home medications. Denies any hypoglycemia syx. Most recent HbA1c 11.3 09/2015. As patient relies on his mother for medication administration and is not always available, Insulin not appropriate.  -Continue Glipizide 5 mg -Continue Metformin 1000 mg BID

## 2015-10-01 NOTE — Patient Instructions (Addendum)
It was a pleasure seeing you today! I'm glad you are feeling better since your hospital discharge!  1. Today we talked about the results of your lumbar puncture that you had when you were in the hospital. The results of this were normal! Please continue taking your Lactulose as instructed. 2. Today we also talked about your right hip bursitis. I'm sorry that Ibuprofen is not working well for your pain. Please try Aleve twice daily and see if this improves your pain. 3. Today we talked about your medications. I have sent refills to your pharmacy. Please take all as directed.

## 2015-10-01 NOTE — Assessment & Plan Note (Addendum)
Patient complains of history consistent with trochanteric bursitis. X-ray showed no abnormalities.  Referral was placed approximately 3 weeks ago to Sports Medicine for injection.  Patient reports little relief with Ibuprofen. Suggested Naproxen twice daily.

## 2015-10-01 NOTE — Assessment & Plan Note (Signed)
Patient was recently admitted twice due to changes in mental status. During first admission this was thought to be secondary to patient incorrectly taking his lactulose however due to patients previous history of sexually transmitted diseases, the team ordered a RPR (negative) and a FTA treponemal antibody which returned positive 2 days after discharge. Patient was subsequently readmitted for an IR guided LP to further evaluate for possibility of late neurosyphilis. Cellular evaluation of CSF did not indicate neurosyphilis and CSF VDRL and FTA-ABS was also performed. Those results were not available at time of discharge so the patient is here to follow up today.  -CSF VDRL and CSF FTA-ABS negative -These results, in addition to patients CSF evaluation, have ruled out neurosyphilis as a cause for patients encephalopathy

## 2015-10-01 NOTE — Progress Notes (Signed)
   CC: follow up of encephalopathy.  HPI:  [name] is a 55 y.o. male with medical history significant for hepatic encephalopathy, chronic hepatitis B, cirrhosis, HTN, Type 2 diabetes and right hip pain who presents to the clinic for follow up of his encephalopathy. The patient recently had 2 admissions for change in his baseline mental status. During the first admission from 8/9-8/11 his change in mental status was thought to be attributed taking his lactulose incorrectly however the admitting team ordered a treponemal antibody which returned positive 2 days after discharge. The patient was subsequently admitted 8/15-8/16 for evaluation of this result and his encephalopathy and had an LP performed which did not show evidence for neurosyphilis. In addition, CSF evaluation for VDRL and FTA-ABS was negative as well.  Currently the patient reports he is at his baseline. He and his mother deny any change in mental status since his hospital discharge. The patient complains of right hip pain which was thought to be trochanteric bursitis during admission. He had an x-ray of BL hips which was normal. Patient requests a steroid injection for this.  Past Medical History:  Diagnosis Date  . Chronic lower back pain   . GERD (gastroesophageal reflux disease)   . Hepatitis B   . Hypertension   . Tuberculosis    "earlier this year; got it cleared up w/treatment" (08/05/2015)  . Type II diabetes mellitus (HCC)     Review of Systems:  Review of Systems  Constitutional: Negative for chills, diaphoresis and fever.  Eyes: Negative for blurred vision and double vision.  Respiratory: Negative for cough, sputum production and shortness of breath.   Cardiovascular: Negative for chest pain and leg swelling.  Gastrointestinal: Positive for diarrhea. Negative for blood in stool, nausea and vomiting.  Neurological: Negative for headaches.     Physical Exam: Physical Exam  Constitutional: He is  well-developed, well-nourished, and in no distress.  HENT:  Head: Normocephalic and atraumatic.  Cardiovascular: Normal rate and regular rhythm.   Pulmonary/Chest: Effort normal and breath sounds normal.  Abdominal: Soft. Bowel sounds are normal. There is no tenderness. There is no rebound.  Skin: Skin is warm and dry.  Psychiatric: Memory, affect and judgment normal.    Vitals:   10/01/15 1018  BP: (!) 144/87  Pulse: 75  Temp: 98.7 F (37.1 C)  TempSrc: Core (Comment)  SpO2: 100%  Weight: 182 lb 11.2 oz (82.9 kg)  Height: 5\' 9"  (1.753 m)    Assessment & Plan:   See Encounters Tab for problem based charting.  Patient seen with Dr. Criselda PeachesMullen

## 2015-10-06 ENCOUNTER — Ambulatory Visit: Payer: Medicaid Other | Admitting: Student

## 2015-10-07 NOTE — Addendum Note (Signed)
Addended by: Neomia DearPOWERS, Madilyn Cephas E on: 10/07/2015 07:13 PM   Modules accepted: Orders

## 2015-10-08 ENCOUNTER — Encounter: Payer: Self-pay | Admitting: Internal Medicine

## 2015-10-08 ENCOUNTER — Ambulatory Visit: Payer: Medicaid Other

## 2015-11-01 ENCOUNTER — Ambulatory Visit: Payer: Self-pay | Admitting: Family Medicine

## 2016-09-06 DEATH — deceased

## 2016-11-16 IMAGING — MR MR LUMBAR SPINE W/O CM
4 of 7 series · 18 of 48 positions shown · non-contrast
Comparison: CT lumbar 08/05/2015

CLINICAL DATA: History of lumbar osteomyelitis. Currently afebrile
with normal white blood count. Abnormal CT lumbar spine

EXAM:
MRI LUMBAR SPINE WITHOUT CONTRAST
TECHNIQUE: Multiplanar, multisequence MR imaging of the lumbar spine was
performed. No intravenous contrast was administered.

[Series 2: T2 · coronal · 5.8mm · 0.55mm/px · 4 of 12 slices shown (1 of 3)]
[im 1/12]
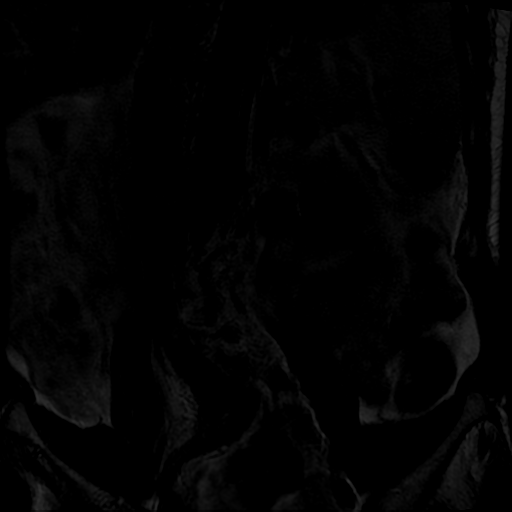
[im 4/12]
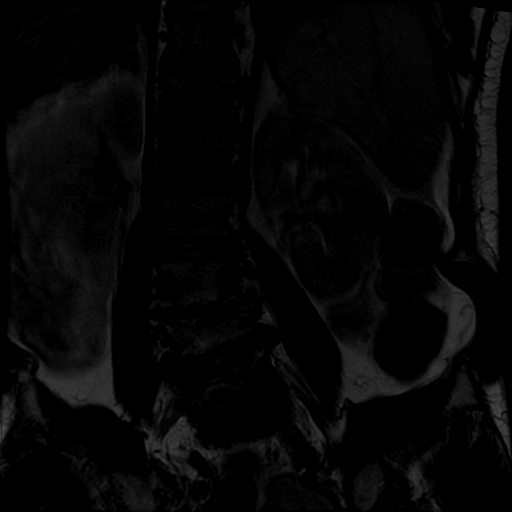
[im 8/12]
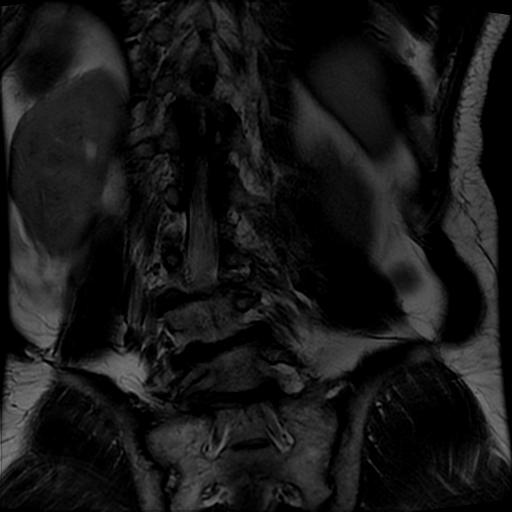
[im 12/12]
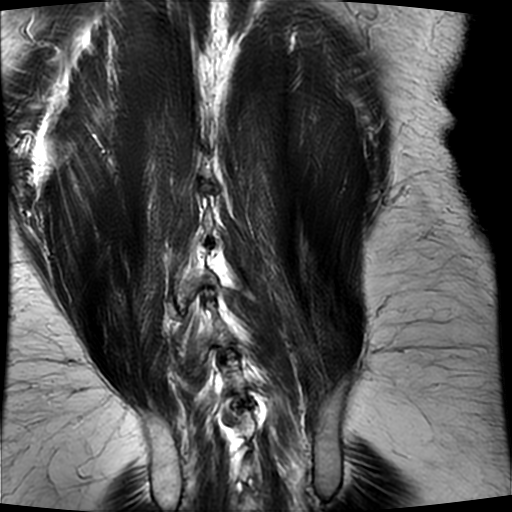

[Series 3: T2 · sagittal · 4.0mm · 0.55mm/px · 5 of 16 slices shown (2 of 3)]
[im 1/16]
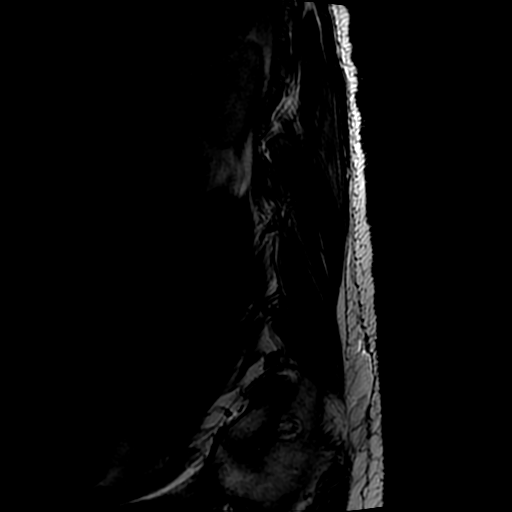
[im 4/16]
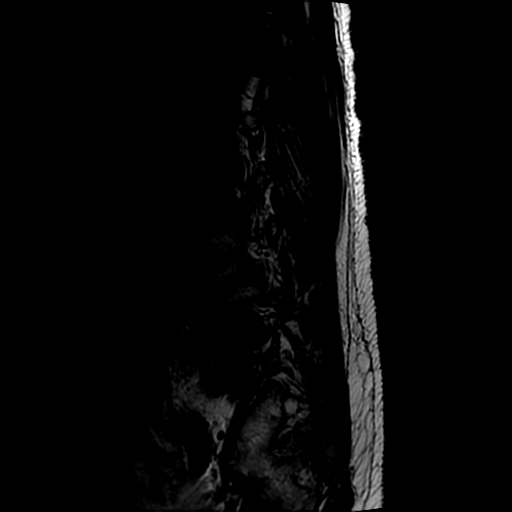
[im 8/16]
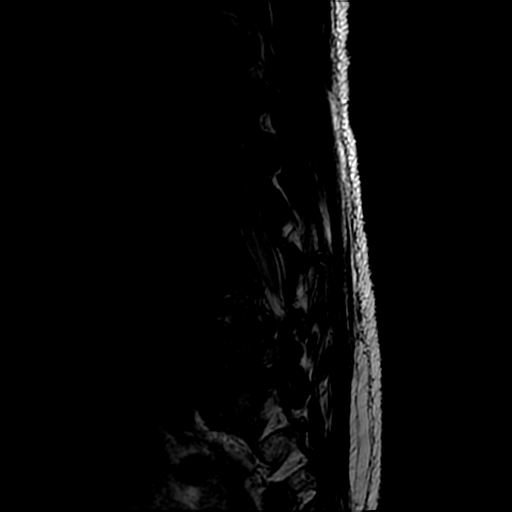
[im 12/16]
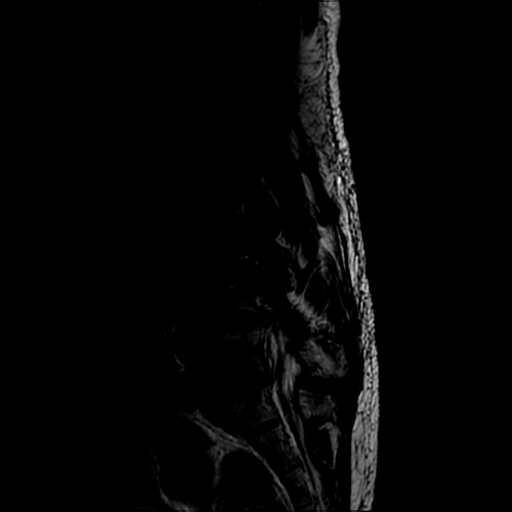
[im 16/16]
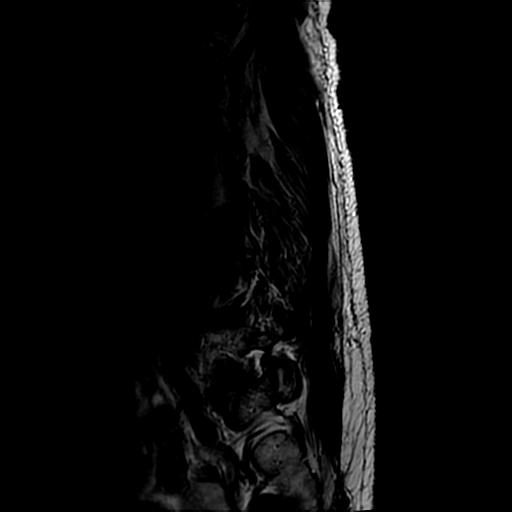

[Series 5: T1 · sagittal · 4.8mm · 0.55mm/px · 3 of 17 slices shown]
[im 1/17]
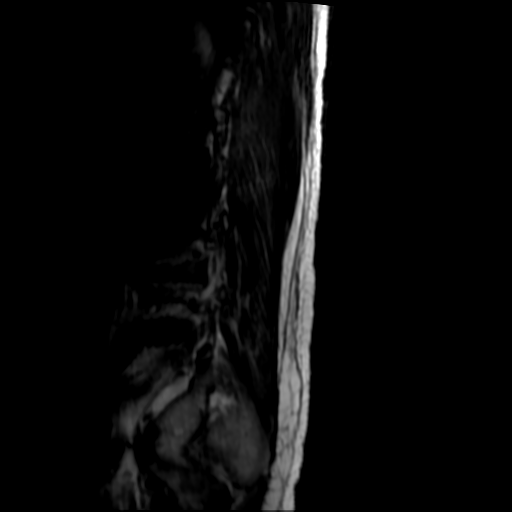
[im 9/17]
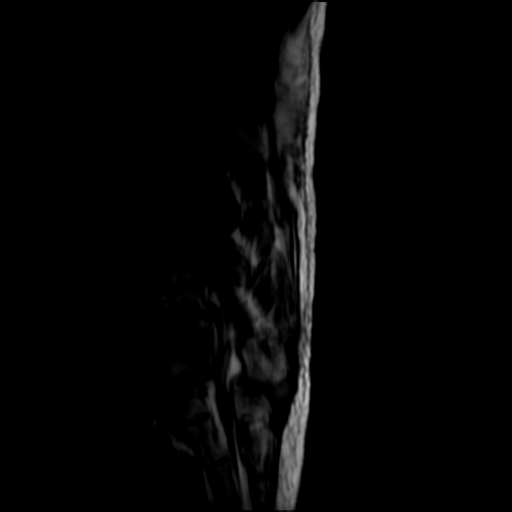
[im 17/17]
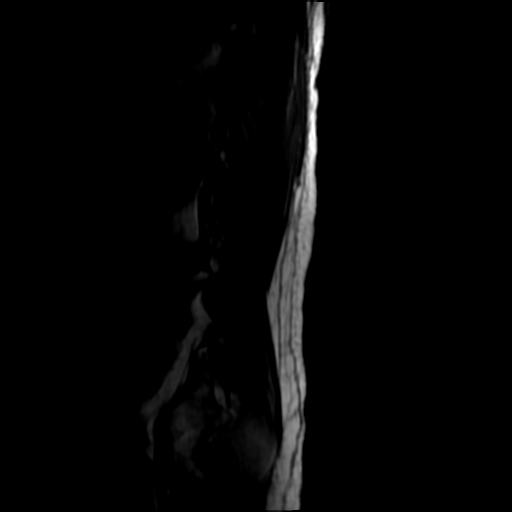

[Series 6: T2 · axial · 5.0mm · 0.39mm/px · z∈[-36,+150]mm · 6 of 40 slices shown (3 of 3)]
[im 1/40]
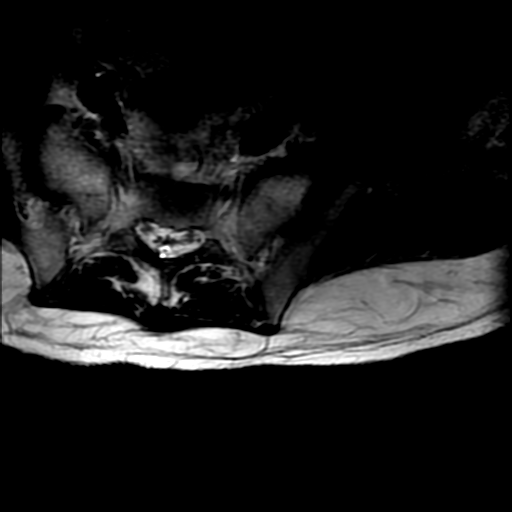
[im 8/40]
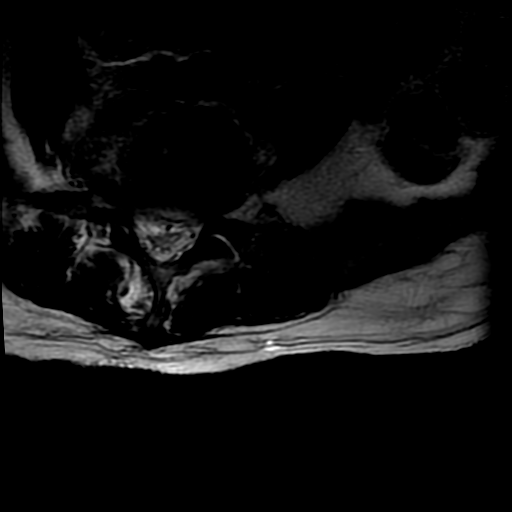
[im 11/40]
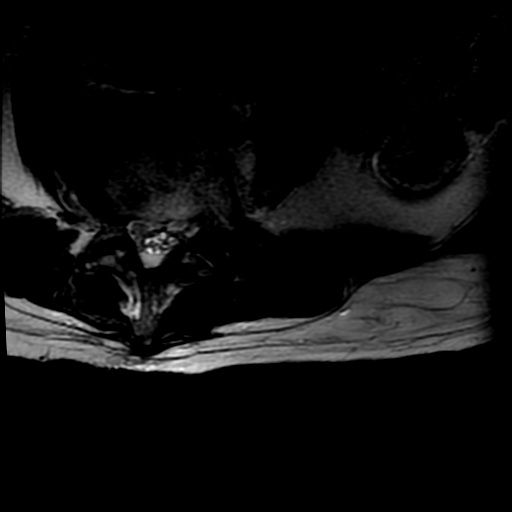
[im 18/40]
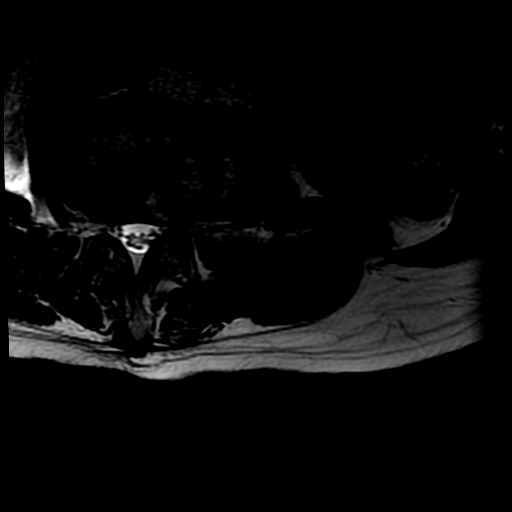
[im 22/40]
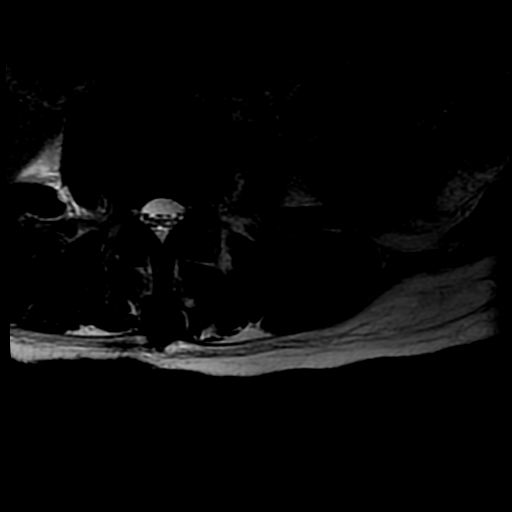
[im 36/40]
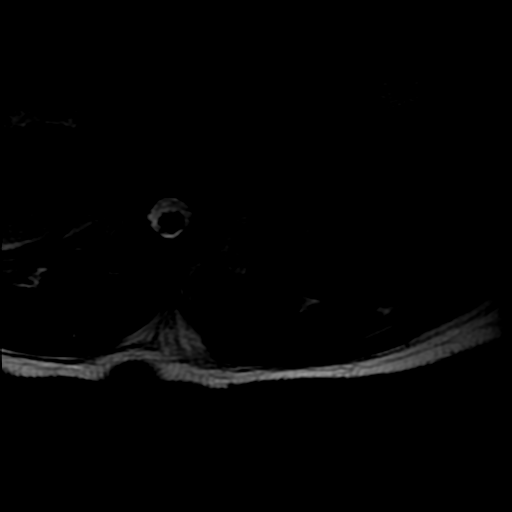

[18 of 48 positions shown; findings below may reference images not displayed]

FINDINGS: The patient was not able to hold still and there is significant
motion on the study.

Segmentation:  Normal segmentation.  Lowest disc space L5-S1

Alignment:  Mild lumbar kyphosis.  Mild retrolisthesis L5-S1.

Vertebrae: Negative for fracture. Mild bone marrow edema at L5. No
significant bone marrow edema at L3 or L4. Extensive endplate cystic
and sclerotic changes at L3-4, L4-5, and L5-S1 similar to the prior
CT.

Conus medullaris: Extends to the L1-2 level and appears normal.

Paraspinal and other soft tissues: Negative for epidural abscess. No
edema in the paraspinous soft tissues.

Disc levels:

L1-2:  Negative

L2-3:  Mild facet degeneration without stenosis

L3-4: Severe changes in the disc space and endplates with endplate
irregularity and sclerosis on CT and is similar in appearance on
MRI. Mild posterior spurring appears chronic. Mild spinal stenosis

L4-5: Severe changes in the endplates with disc space narrowing and
cystic changes in the endplates similar to the prior CT. Posterior
osteophyte formation with mild spinal stenosis. Moderate foraminal
narrowing on the left.

L5-S1: Mild edema at L5 and S1 bone marrow. Cystic changes in the
endplates especially the inferior plate of L5 is unchanged from the
prior study.
IMPRESSION: Marked abnormality in the disc spaces and endplates at L3-4, L4-5,
and L5-S1 with a chronic appearance and unchanged from the CT of
08/05/2015. This may be due to chronic treated osteomyelitis.
Spondyloarthropathy of dialysis could have this appearance however
the patient has nearly normal renal function at this time.

Negative for fracture.

Image quality degraded by significant motion.

## 2016-11-20 IMAGING — DX DG HIP (WITH OR WITHOUT PELVIS) 2-3V*R*
3 series · 3 of 3 positions shown · non-contrast
Comparison: None.

CLINICAL DATA: Right hip pain for 2 months.  No known injury.

EXAM:
DG HIP (WITH OR WITHOUT PELVIS) 2-3V RIGHT

[t pelvis ap]
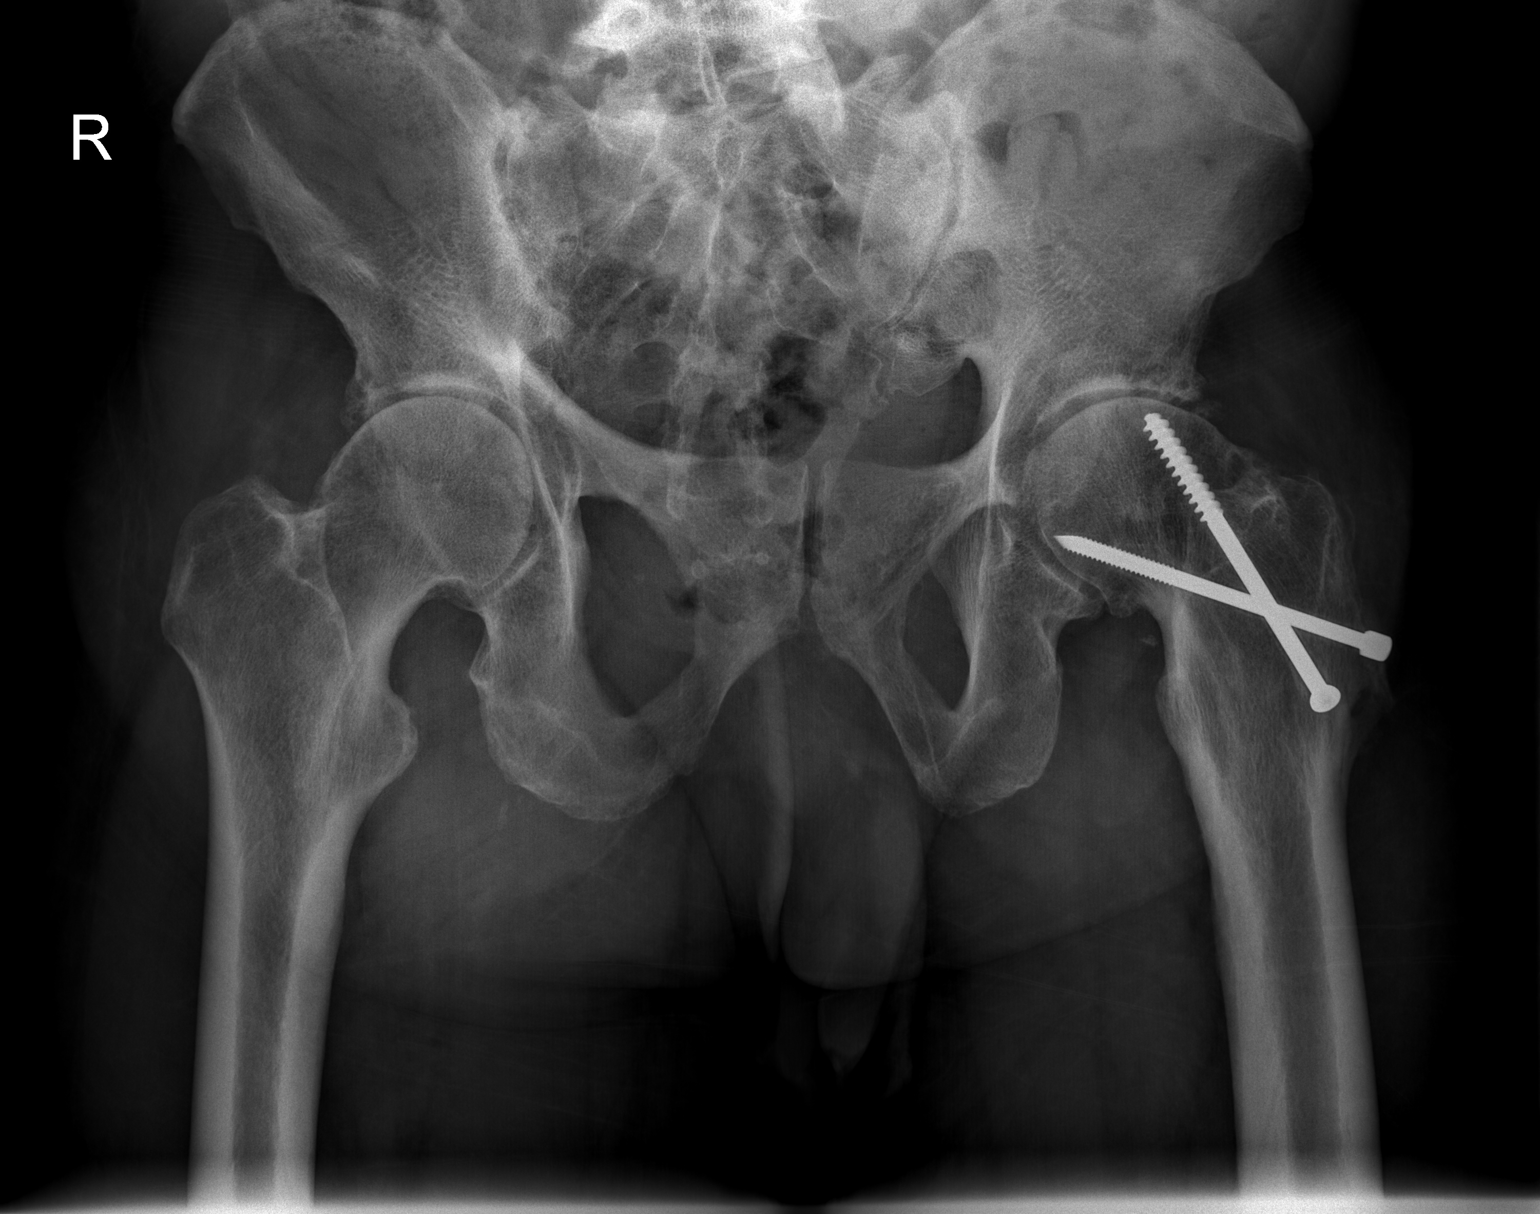

[t hip ap right]
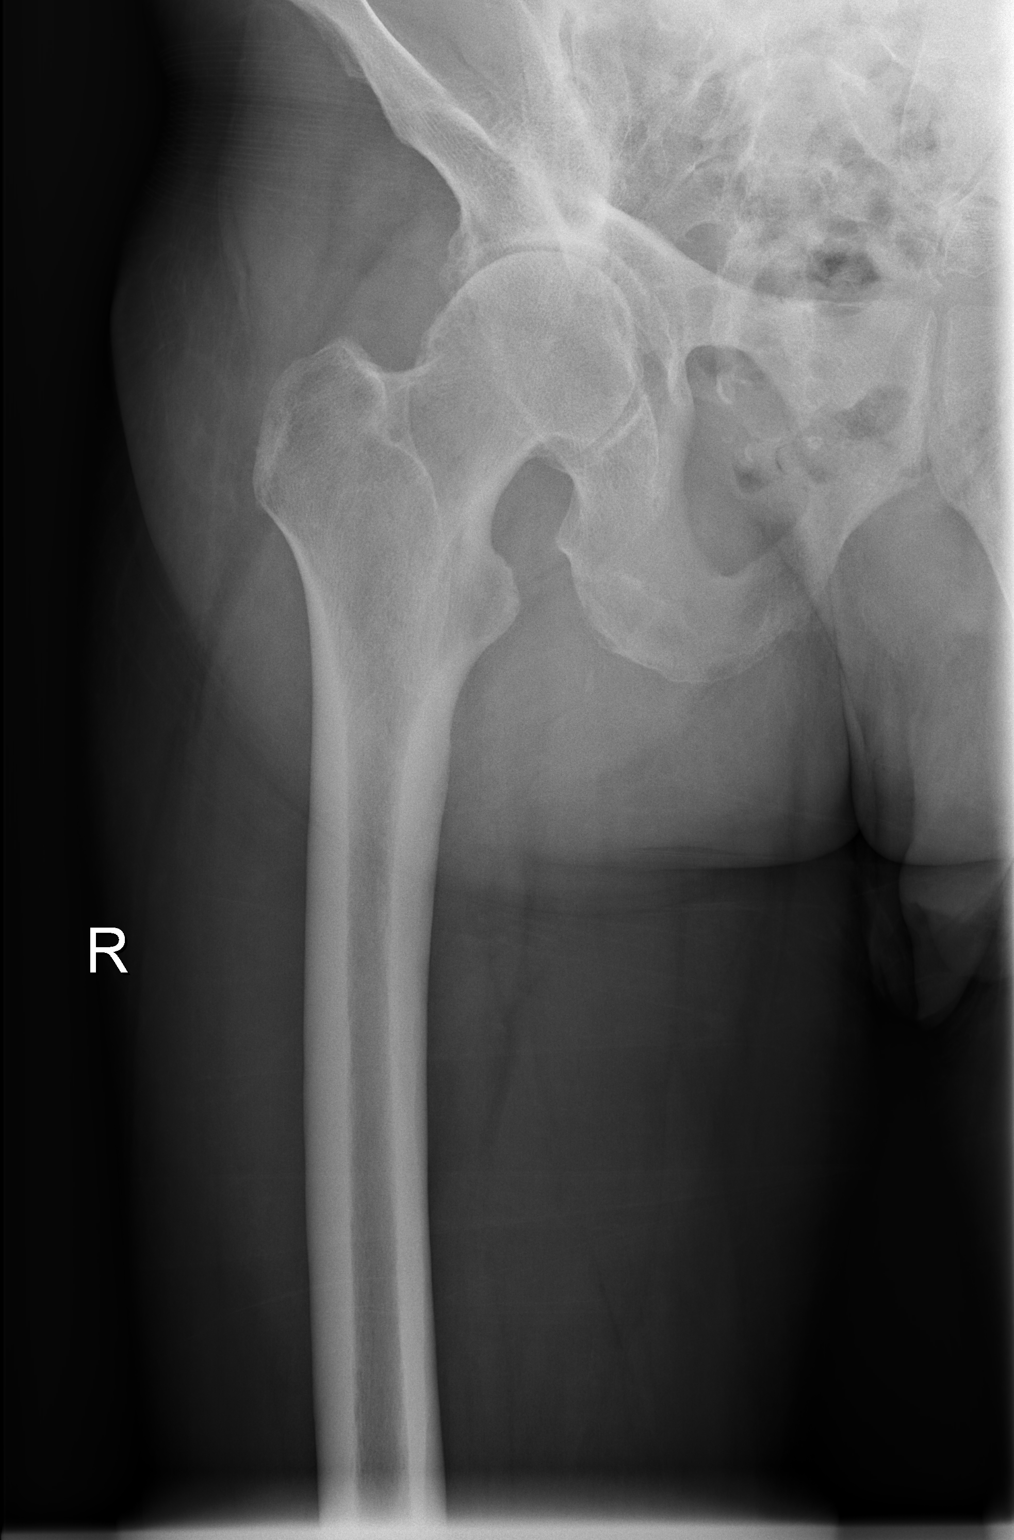

[t hip frog leg right]
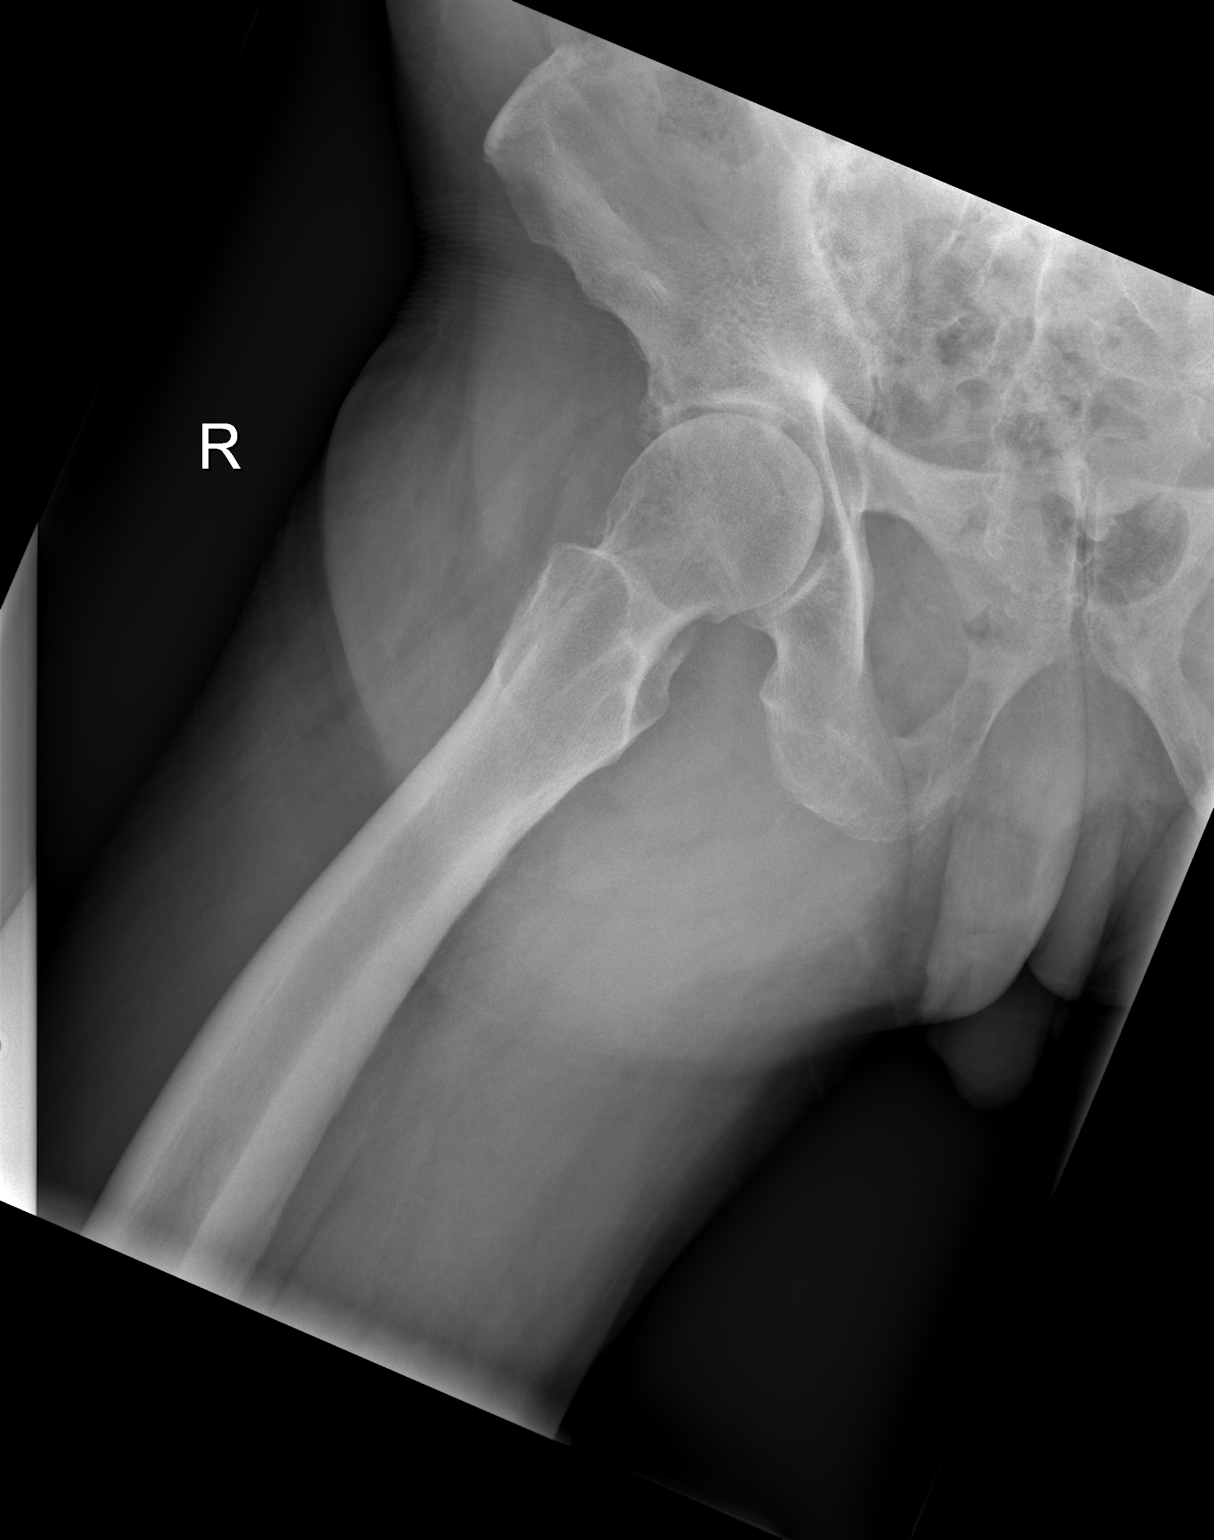

[3 of 3 positions shown; findings below may reference images not displayed]

FINDINGS: Right hip is located. No acute bone or soft tissue abnormality is
present. Left proximal femur ORIF is noted. The pelvis is intact.
IMPRESSION: 1. No acute abnormality of the right hip.
2. Remote ORIF of the proximal left femur.

## 2017-01-16 ENCOUNTER — Other Ambulatory Visit: Payer: Self-pay | Admitting: Internal Medicine
